# Patient Record
Sex: Female | Born: 1990 | Race: White | Hispanic: No | Marital: Married | Smoking: Never smoker
Health system: Southern US, Academic
[De-identification: ages and names within clinical notes are randomized; demographics above are authoritative.]

## PROBLEM LIST (undated history)

## (undated) DIAGNOSIS — E7800 Pure hypercholesterolemia, unspecified: Secondary | ICD-10-CM

## (undated) DIAGNOSIS — F419 Anxiety disorder, unspecified: Secondary | ICD-10-CM

## (undated) DIAGNOSIS — M545 Low back pain, unspecified: Secondary | ICD-10-CM

## (undated) HISTORY — DX: Low back pain, unspecified: M54.50

## (undated) HISTORY — DX: Anxiety disorder, unspecified: F41.9

---
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
## 2021-01-14 IMAGING — US US Pelvic Complete
1 series · 13 of 16 positions shown · non-contrast
Comparison: None.

US Pelvic Complete
INDICATION: Vaginal Bleeding retained products of conception.                            
 LMP: Unsure. Post menopausal: No.
HISTORY: The patient states she is 5 days postpartum after an induced                     
 stillbirth                                                                                
 measuring 18 weeks at the 24 week mark.
TECHNIQUE: Transabdominal pelvic ultrasound was performed.                                
 Doppler: Color Doppler was utilized.                                                      
 Technologist Comments: None.                                                              
 Limitations: None.

[Series 1: us pelvic complete · 51 acquisitions, 13 frames shown]
[im 1/51]
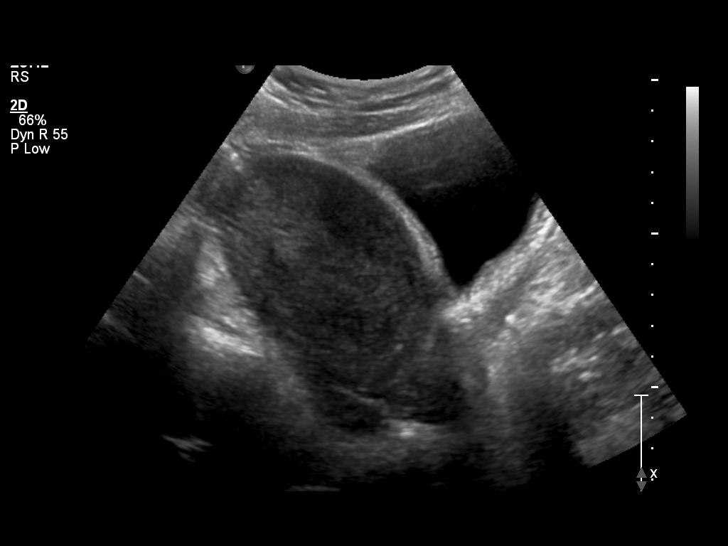
[im 4/51]
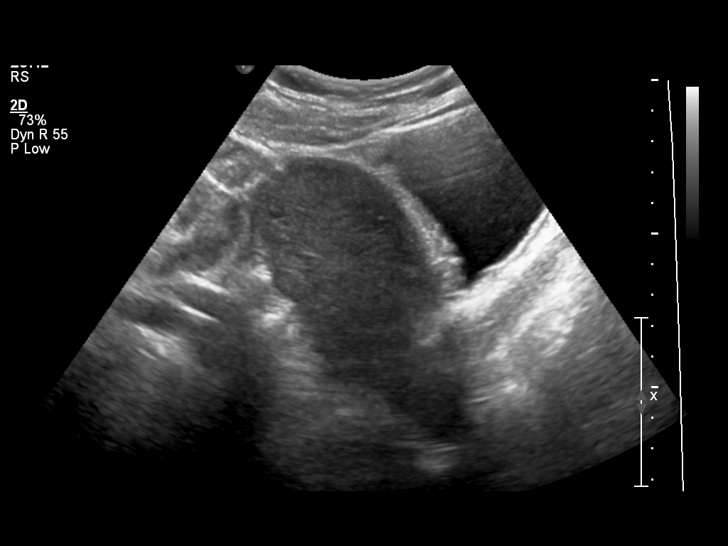
[im 11/51]
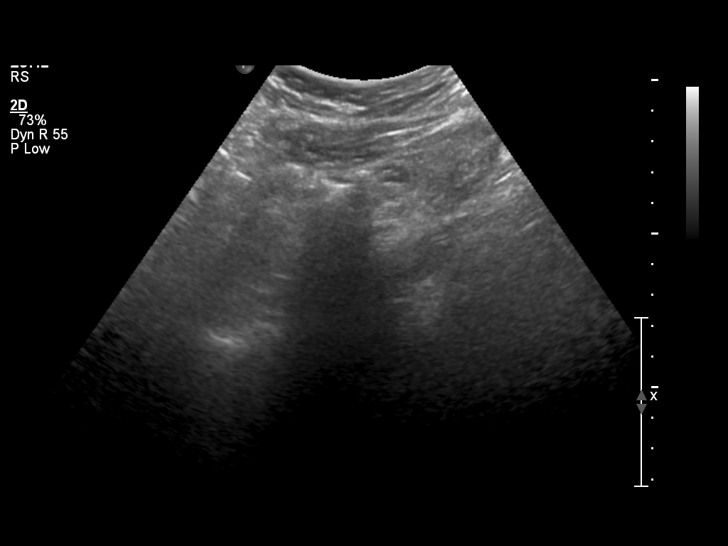
[im 14/51]
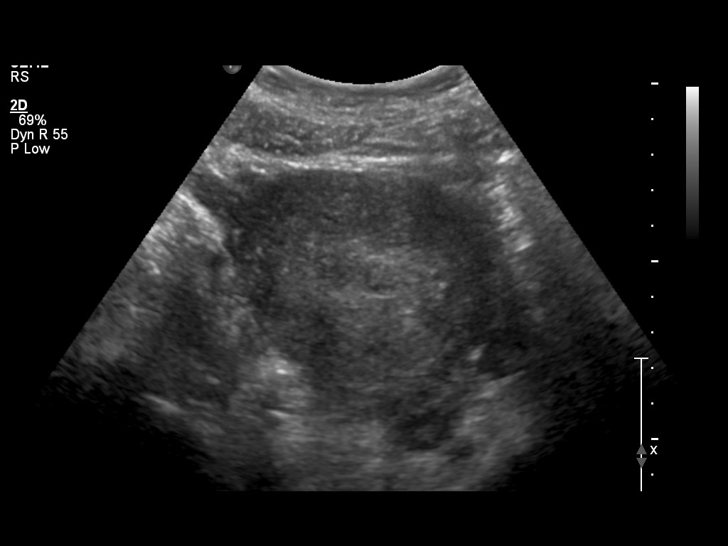
[im 17/51]
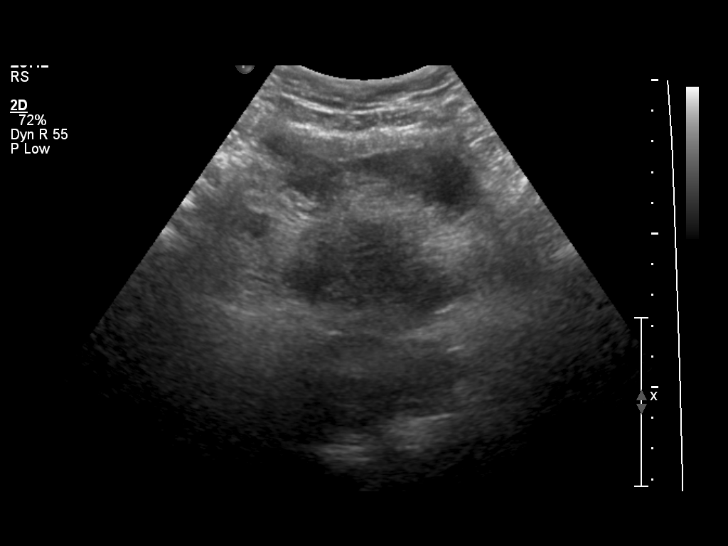
[im 21/51]
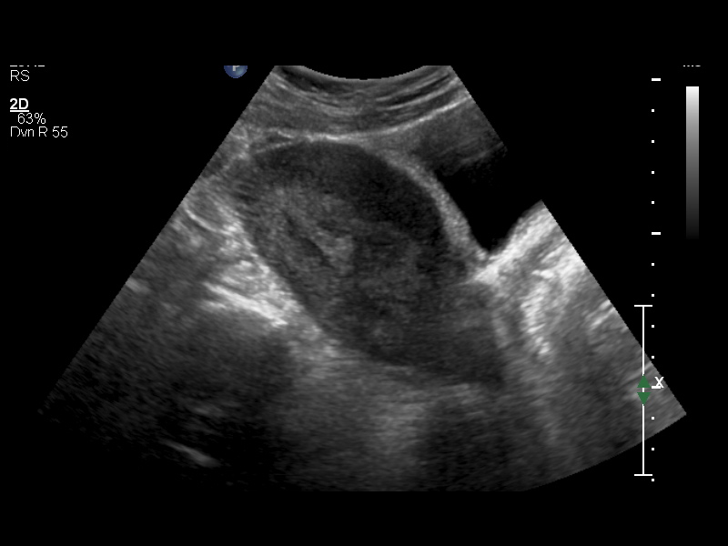
[im 27/51]
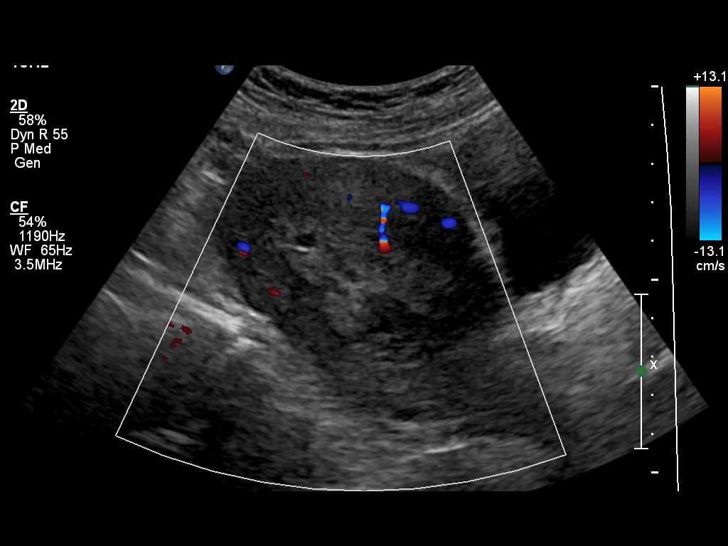
[im 31/51]
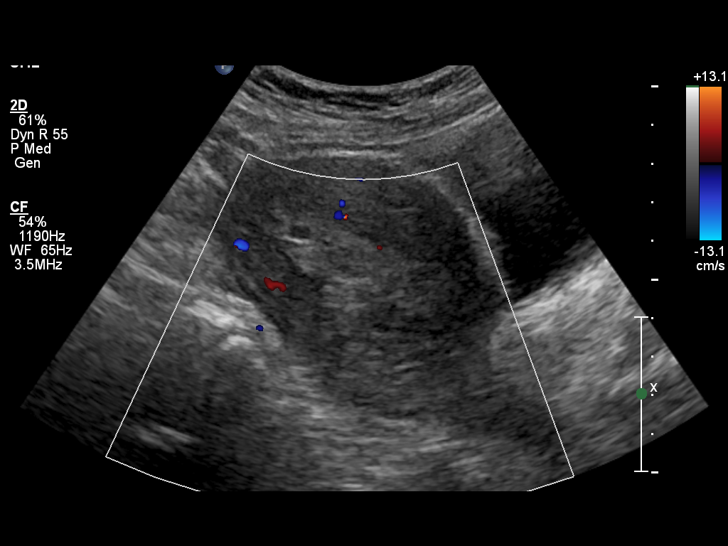
[im 34/51]
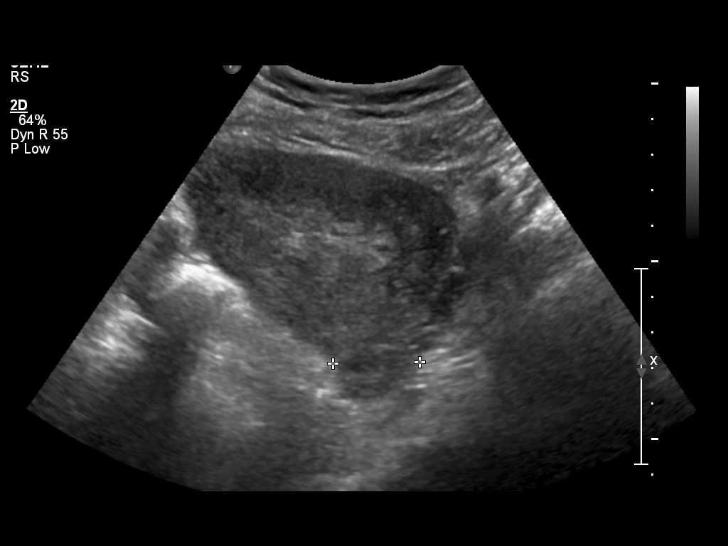
[im 37/51]
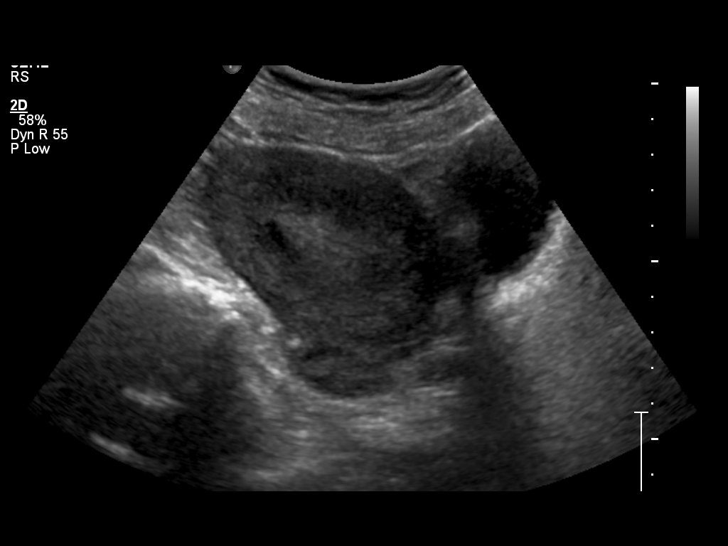
[im 41/51]
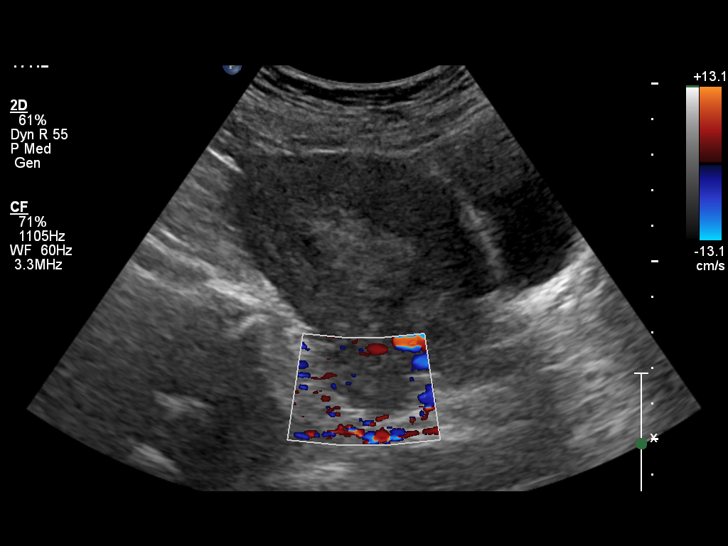
[im 47/51]
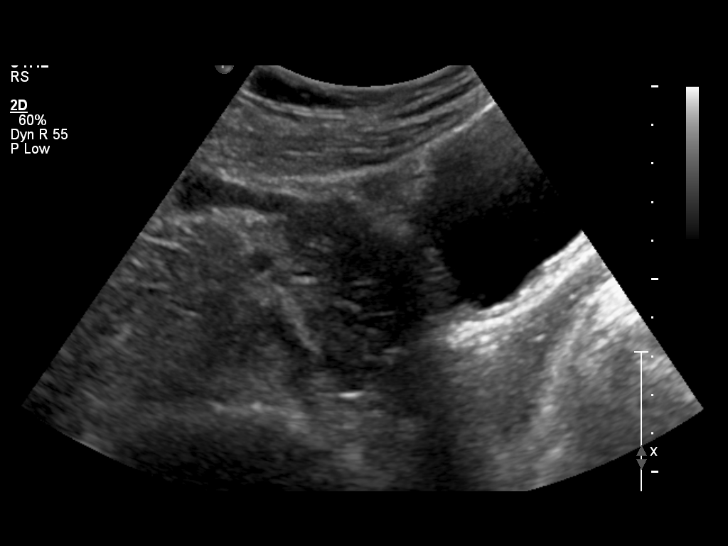
[im 51/51]
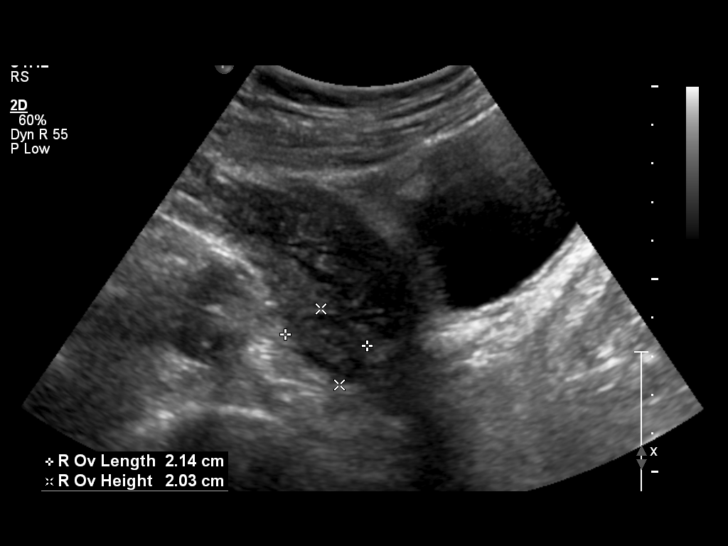

[13 of 16 positions shown; findings below may reference images not displayed]

FINDINGS: UTERUS                                                                                    
 Size (cm): 10.8 cm x 4.8 cm x 7.1 cm                                                      
 Appearance: Normal.                                                                       
 Lesion(s): None.                                                                          
 ENDOMETRIUM                                                                               
 Thickness (cm): 2.8 cm. Thickened with some vascularity appearing to enter into           
 the endometrium.                                                                          
 Cavity: Heterogenous.                                                                     
 RIGHT OVARY                                                                               
 Size (cm): 2.1 cm x 2.0 cm x 2.4 cm.                                                      
 Doppler: Blood flow identified by color Doppler.                                          
 Lesion (cm): None, normal appearance.                                                     
 Adnexa: Grossly normal.                                                                   
 LEFT OVARY                                                                                
 Size (cm): 3.1 cm x 1.5 cm x 2.4 cm.                                                      
 Doppler: Blood flow identified by color Doppler.                                          
 Lesion (cm): None, normal appearance.                                                     
 Adnexa: Grossly normal.                                                                   
 ADDITIONAL                                                                                
 Pelvic fluid: No significant pelvic fluid.                                                
 Other: None.
IMPRESSION: 1.  Enlarged heterogeneous uterus consistent with recent postpartum state. ]              
 2.  There is heterogeneous thickened endometrium with vascular flow noted                 
 concerning for retained product of conception.

## 2021-08-17 ENCOUNTER — Other Ambulatory Visit: Payer: Self-pay

## 2021-08-17 ENCOUNTER — Ambulatory Visit: Attending: GENERAL | Admitting: GENERAL

## 2021-08-17 ENCOUNTER — Encounter (INDEPENDENT_AMBULATORY_CARE_PROVIDER_SITE_OTHER): Payer: Self-pay | Admitting: GENERAL

## 2021-08-17 VITALS — BP 121/79 | HR 65 | Temp 97.8°F | Resp 16 | Ht 64.0 in | Wt 162.2 lb

## 2021-08-17 DIAGNOSIS — F418 Other specified anxiety disorders: Secondary | ICD-10-CM | POA: Insufficient documentation

## 2021-08-17 DIAGNOSIS — Z131 Encounter for screening for diabetes mellitus: Secondary | ICD-10-CM | POA: Insufficient documentation

## 2021-08-17 DIAGNOSIS — Z0000 Encounter for general adult medical examination without abnormal findings: Secondary | ICD-10-CM | POA: Insufficient documentation

## 2021-08-17 DIAGNOSIS — O99345 Other mental disorders complicating the puerperium: Secondary | ICD-10-CM | POA: Insufficient documentation

## 2021-08-17 DIAGNOSIS — Z1389 Encounter for screening for other disorder: Secondary | ICD-10-CM | POA: Insufficient documentation

## 2021-08-17 DIAGNOSIS — F411 Generalized anxiety disorder: Secondary | ICD-10-CM | POA: Insufficient documentation

## 2021-08-17 DIAGNOSIS — Z136 Encounter for screening for cardiovascular disorders: Secondary | ICD-10-CM | POA: Insufficient documentation

## 2021-08-17 DIAGNOSIS — Z13220 Encounter for screening for lipoid disorders: Secondary | ICD-10-CM | POA: Insufficient documentation

## 2021-08-17 MED ORDER — FLUOXETINE 20 MG CAPSULE
20.0000 mg | ORAL_CAPSULE | Freq: Every day | ORAL | 0 refills | Status: DC
Start: 2021-09-07 — End: 2021-11-26

## 2021-08-17 MED ORDER — FLUOXETINE 10 MG CAPSULE
10.0000 mg | ORAL_CAPSULE | Freq: Every day | ORAL | 0 refills | Status: DC
Start: 2021-08-17 — End: 2021-10-08

## 2021-08-17 NOTE — Nursing Note (Signed)
Chief Complaint:   Chief Complaint              Establish Care     Physical     Anxiety           Functional Health Screen  Functional Health Screening:        BP 121/79   Pulse 65   Temp 36.6 C (97.8 F) (Oral)   Resp 16   Ht 1.626 m (5\' 4" )   Wt 73.6 kg (162 lb 3.2 oz)   LMP 08/13/2021 (Exact Date)   BMI 27.84 kg/m       Social History     Tobacco Use   Smoking Status Never   Smokeless Tobacco Never     Patient Health Rating           Depression Screening  PHQ Questionnaire  Little interest or pleasure in doing things.: Nearly every day  Feeling down, depressed, or hopeless: Several Days  PHQ 2 Total: 4  Trouble falling or staying asleep, or sleeping too much.: More than half the days  Feeling tired or having little energy: More than half the days  Poor appetite or overeating: Not at all  Feeling bad about yourself/ that you are a failure in the past 2 weeks?: More than half the days  Trouble concentrating on things in the past 2 weeks?: More than half the days  Moving/Speaking slowly or being fidgety or restless  in the past 2 weeks?: Several Days  Thoughts that you would be better off DEAD, or of hurting yourself in some way.: Not at all  PHQ 9 Total: 13  Interpretation of Total Score: 10-14 Moderate depression  Allergies:  No Known Allergies  Medication History  Reviewed for OTC medication and any new medications, provider will review medication history  Results through Enter/Edit  No results found for this or any previous visit (from the past 24 hour(s)).  POCT Results  Care Team  Patient Care Team:  08/15/2021, DO as PCP - General (FAMILY MEDICINE)  Immunizations - last 24 hours       None          Jacinta Shoe, Sharyn Lull  08/17/2021, 09:37

## 2021-08-17 NOTE — Progress Notes (Signed)
[facility] FAMILY MEDICINE  A department of [facility]  [address]  [place] [address]  [phone]    Name: Catherine Colon MRN:  [id number]   Date: 08/17/2021 DOB: December 03, 1990                       Chief complaint:   Chief Complaint   Patient presents with    Establish Care    Physical    Anxiety         Assessment/Plan  Peytan Andringa is a 31 y.o. female with:    1. GAD (generalized anxiety disorder) (Primary)  Anxiety has been long-standing but with worsening symptoms recently. [place]oved to the area from [place] after recent surrogacy pregnancy loss and has felt increased anxiety and like she has not been able to enjoy her children as much. Has been seeking outpatient therapy services but did not feel like they were helpful. Will start fluoxetine 10 mg x 3 weeks and increase to 20 mg at that time. Follow up in 6 weeks.  -     FLUoxetine (PROZAC) 10 mg Oral Capsule; Take 1 Capsule (10 mg total) by mouth Once a day  -     FLUoxetine (PROZAC) 20 mg Oral Capsule; Take 1 Capsule (20 mg total) by mouth Once a day    2. Postpartum anxiety  As above. IUFD at 24 weeks in December 2022.  -     FLUoxetine (PROZAC) 10 mg Oral Capsule; Take 1 Capsule (10 mg total) by mouth Once a day  -     FLUoxetine (PROZAC) 20 mg Oral Capsule; Take 1 Capsule (20 mg total) by mouth Once a day    3. Encounter for lipid screening for cardiovascular disease  Reports elevated cholesterol level in childhood and was on statin therapy until her recent pregnancies. Will reassess and determine therapy. Likely familial hypercholesterolemia.  -     LIPID PANEL; Future    4. Screening for diabetes mellitus  -     HGA1C (HEMOGLOBIN A1C WITH EST AVG GLUCOSE); Future    5. Screening for nephropathy  -     COMPREHENSIVE METABOLIC PANEL, NON-FASTING; Future    Return to clinic: Return in about 6 weeks (around 09/28/2021) for Mood Check, Med Check - fluoxetine.      Health Maintenance:  Blood pressure screen: 121/79    Colon cancer screen: N/A  [Current guidelines start at age 2; stop at age 33]   Breast cancer screen: N/A [Current guidelines start at age 59; stop age at 75]   Cervical cancer screen: Reports last Pap smear during surrogacy workup in early 2022; denies history of abnormal Pap [Current guidelines start at age 37; stop at age 1]   Osteoporosis screen: N/A    Lung cancer screen: N/A [Age 56-77; 20 pack-yrs; current or stopped smoking w/i last 15 years]   Lipid screen: Pending/ Ordered    Diabetes screen: Pending/ Ordered    Tobacco/alcohol use: None    Diet/exercise counseling: Normal diet and exercise         Subjective:  History of Presenting Illness:   [name] is a 31 y.o. female presenting to establish care.     Patient presents with a Hx of depression/anxiety. Symptoms have been present for many years, but has since worsened. She has 2 young kids at home and feels herself being "too busy" for them or easily irritated by them. She feels that she is missing "watching them  grow up." Patient participated in a surrogacy 1 year ago and had a miscarriage in December. This hit her hard emotionally, and the family moved soon after. Post partum, she is doing better. She does not feel that her depression stems directly from this event. She has tried therapy in the past with Fritzi Mandes , which didn't help her much. She would like to try medication options.       Objective  Vitals:   BP 121/79   Pulse 65   Temp 36.6 C (97.8 F) (Oral)   Resp 16   Ht 1.626 m (5\' 4" )   Wt 73.6 kg (162 lb 3.2 oz)   LMP 08/13/2021 (Exact Date)   BMI 27.84 kg/m       Physical Exam  Constitutional:       Appearance: Normal appearance.   HENT:      Head: Atraumatic.      Right Ear: External ear normal.      Left Ear: External ear normal.      Nose: Nose normal.      Mouth/Throat:      Pharynx: Oropharynx is clear.   Eyes:      Conjunctiva/sclera: Conjunctivae normal.   Cardiovascular:      Rate and Rhythm: Normal rate and regular rhythm.      Pulses: Normal  pulses.      Heart sounds: Normal heart sounds.   Pulmonary:      Effort: Pulmonary effort is normal.      Breath sounds: Normal breath sounds.   Abdominal:      General: Bowel sounds are normal.   Musculoskeletal:         General: Normal range of motion.      Cervical back: Normal range of motion.   Skin:     General: Skin is warm and dry.   Neurological:      Mental Status: She is oriented to person, place, and time.   Psychiatric:         Mood and Affect: Mood normal.         Behavior: Behavior normal.         Thought Content: Thought content normal.         Judgment: Judgment normal.       I am scribing for, and in the presence of, Dr. 08/15/2021 for services provided on 08/17/2021.  08/19/2021, SCRIBE   Daryl Eastern, SCRIBE      I personally performed the services described in this documentation, as scribed  in my presence, and it is both accurate  and complete.    Daryl Eastern, DO  [facility]    Portions of this note may be dictated using voice recognition software or a dictation service. Variances in spelling and vocabulary are possible and unintentional. Not all errors are caught/corrected. Please notify the Jacinta Shoe if any discrepancies are noted or if the meaning of any statement is not clear.

## 2021-08-17 NOTE — Patient Instructions (Signed)
Instructions for Labs Ordered --    Cholesterol: Ideally is performed fasting - nothing to eat or drink except water or black coffee (no sugar) for 12 to 16 hours prior to getting labs drawn. The value most affected by eating prior to this lab is triglycerides, so if fasting is not feasible, then please get the labs done when it is convenient for you.    Hemoglobin A1c: This is an estimate of your blood sugar over the last 3 months. This value is not affected by eating the day of the lab but could be affected by anemia or other things that affect red blood cells.    Comprehensive Metabolic Panel (CMP): This test helps us to measure your electrolytes, kidney function and liver function. Unless expressly told, this does not need to be performed fasting.

## 2021-09-08 ENCOUNTER — Other Ambulatory Visit: Attending: GENERAL

## 2021-09-08 ENCOUNTER — Other Ambulatory Visit: Payer: Self-pay

## 2021-09-08 DIAGNOSIS — Z136 Encounter for screening for cardiovascular disorders: Secondary | ICD-10-CM | POA: Insufficient documentation

## 2021-09-08 DIAGNOSIS — Z13220 Encounter for screening for lipoid disorders: Secondary | ICD-10-CM | POA: Insufficient documentation

## 2021-09-08 DIAGNOSIS — Z131 Encounter for screening for diabetes mellitus: Secondary | ICD-10-CM | POA: Insufficient documentation

## 2021-09-08 DIAGNOSIS — Z1389 Encounter for screening for other disorder: Secondary | ICD-10-CM | POA: Insufficient documentation

## 2021-09-08 LAB — COMPREHENSIVE METABOLIC PANEL, NON-FASTING
ALBUMIN: 4.2 g/dL (ref 3.5–5.0)
ALKALINE PHOSPHATASE: 61 U/L (ref 40–110)
ALT (SGPT): 17 U/L (ref 8–22)
ANION GAP: 7 mmol/L (ref 4–13)
AST (SGOT): 16 U/L (ref 8–45)
BILIRUBIN TOTAL: 0.5 mg/dL (ref 0.3–1.3)
BUN/CREA RATIO: 18 (ref 6–22)
BUN: 14 mg/dL (ref 8–25)
CALCIUM: 9.6 mg/dL (ref 8.5–10.0)
CHLORIDE: 106 mmol/L (ref 96–111)
CO2 TOTAL: 24 mmol/L (ref 22–30)
CREATININE: 0.8 mg/dL (ref 0.60–1.05)
ESTIMATED GFR: >90 mL/min/BSA (ref >=60)
GLUCOSE: 81 mg/dL (ref 65–125)
POTASSIUM: 3.9 mmol/L (ref 3.5–5.1)
PROTEIN TOTAL: 6.9 g/dL (ref 6.4–8.3)
SODIUM: 137 mmol/L (ref 136–145)

## 2021-09-08 LAB — LIPID PANEL
CHOL/HDL RATIO: 4.3
CHOLESTEROL: 289 mg/dL — ABNORMAL HIGH (ref 100–200)
HDL CHOL: 68 mg/dL (ref >=50)
LDL CALC: 215 mg/dL — ABNORMAL HIGH (ref <100)
NON-HDL: 221 mg/dL — ABNORMAL HIGH (ref <=190)
TRIGLYCERIDES: 49 mg/dL (ref <150)
VLDL CALC: 10 mg/dL (ref <30)

## 2021-09-08 LAB — HGA1C (HEMOGLOBIN A1C WITH EST AVG GLUCOSE)
ESTIMATED AVERAGE GLUCOSE: 105 mg/dL
HEMOGLOBIN A1C: 5.3 % (ref 4.0–5.6)

## 2021-10-08 ENCOUNTER — Encounter (INDEPENDENT_AMBULATORY_CARE_PROVIDER_SITE_OTHER): Payer: Self-pay | Admitting: GENERAL

## 2021-10-08 ENCOUNTER — Other Ambulatory Visit: Payer: Self-pay

## 2021-10-08 ENCOUNTER — Ambulatory Visit: Attending: GENERAL | Admitting: GENERAL

## 2021-10-08 VITALS — BP 115/74 | HR 59 | Temp 97.8°F | Resp 16 | Ht 64.0 in | Wt 160.6 lb

## 2021-10-08 DIAGNOSIS — F418 Other specified anxiety disorders: Secondary | ICD-10-CM

## 2021-10-08 DIAGNOSIS — E785 Hyperlipidemia, unspecified: Secondary | ICD-10-CM | POA: Insufficient documentation

## 2021-10-08 DIAGNOSIS — O99345 Other mental disorders complicating the puerperium: Secondary | ICD-10-CM | POA: Insufficient documentation

## 2021-10-08 DIAGNOSIS — F411 Generalized anxiety disorder: Secondary | ICD-10-CM | POA: Insufficient documentation

## 2021-10-08 MED ORDER — ROSUVASTATIN 20 MG TABLET
20.0000 mg | ORAL_TABLET | Freq: Every evening | ORAL | 4 refills | Status: DC
Start: 2021-10-08 — End: 2022-10-22

## 2021-10-08 NOTE — Nursing Note (Signed)
Chief Complaint:   Chief Complaint              Follow Up Mood Check     Medication Check           Functional Health Screen  Functional Health Screening:        BP 115/74   Pulse 59   Temp 36.6 C (97.8 F) (Oral)   Resp 16   Ht 1.626 m (5\' 4" )   Wt 72.8 kg (160 lb 9.6 oz)   LMP 09/12/2021 (Exact Date)   BMI 27.57 kg/m       Social History     Tobacco Use   Smoking Status Never   Smokeless Tobacco Never     Patient Health Rating           Depression Screening  PHQ Questionnaire     Allergies:  No Known Allergies  Medication History  Reviewed for OTC medication and any new medications, provider will review medication history  Results through Enter/Edit  No results found for this or any previous visit (from the past 24 hour(s)).  POCT Results  Care Team  Patient Care Team:  Otelia Santee, DO as PCP - General (FAMILY MEDICINE)  Immunizations - last 24 hours       None          Lavell Islam, [place]  10/08/2021, 16:32

## 2021-10-08 NOTE — Progress Notes (Signed)
[facility] FAMILY MEDICINE  A department of [facility]  [address]  [place] [address]  [phone]    Name: Catherine Colon MRN:  [id number]   Date: 10/08/2021 DOB: 1990/07/23                       Chief complaint:   Chief Complaint   Patient presents with    Follow Up Mood Check    Medication Check         Assessment/Plan  Catherine Colon is a 31 y.o. female with:    1. GAD (generalized anxiety disorder) (Primary)  Symptoms have significantly improved but still with some mild adverse effects. Will continue with fluoxetine 20 mg once daily for now but discussed that we could increase if the side effects improve and she thinks that anxiety could still be better controlled. Discussed SSRI safety during pregnancy, especially vs stopping medication and having uncontrolled anxiety.    2. Postpartum anxiety  As above.    3. Dyslipidemia  Likely familial hypercholesterolemia. Does not remember which medication she was on previously but does not remember any significant side effects. Will start rosuvastatin 20 mg once daily and recheck lipid panel/hepatic panel after 6 months to assess efficacy. Will have her wait until after her period comes to start statin therapy to ensure that she is not pregnant.  -     rosuvastatin (CRESTOR) 20 mg Oral Tablet; Take 1 Tablet (20 mg total) by mouth Every evening  -     LIPID PANEL; Future  -     HEPATIC FUNCTION PANEL; Future    Return to clinic: Return in about 6 months (around 04/08/2022) for Med Check, Mood Check.      Subjective:  History of Presenting Illness:   Catherine Colon is a 31 y.o. female who presents to clinic for medication check after starting fluoxetine at last visit for GAD and postpartum anxiety. Feels like she has been doing much better.    Did not notice a change initially with medication, but husband noticed that she seemed lighter and was smiling more. Feels like she has felt more of a difference these past couple of weeks. Has been having some  headache since starting medication, including nighttime headaches. These are improving. Had unprotected intercourse with husband prior to 8 week s/p vasectomy -- is due for her period in a few days but wanted to make sure that medication is safe during pregnancy.      Objective  Vitals:   BP 115/74   Pulse 59   Temp 36.6 C (97.8 F) (Oral)   Resp 16   Ht 1.626 m (5\' 4" )   Wt 72.8 kg (160 lb 9.6 oz)   LMP 09/12/2021 (Exact Date)   BMI 27.57 kg/m       Physical Exam  Vitals and nursing note reviewed.   Constitutional:       Appearance: Normal appearance.   HENT:      Head: Normocephalic and atraumatic.   Eyes:      Conjunctiva/sclera: Conjunctivae normal.   Pulmonary:      Effort: Pulmonary effort is normal. No respiratory distress.   Musculoskeletal:      Cervical back: Normal range of motion.   Neurological:      General: No focal deficit present.      Mental Status: She is alert.   Psychiatric:         Mood and Affect: Mood normal.  Behavior: Behavior normal.       Hinton Dyer Nanda Quinton, DO  [facility]    Portions of this note may be dictated using voice recognition software or a dictation service. Variances in spelling and vocabulary are possible and unintentional. Not all errors are caught/corrected. Please notify the [facility] if any discrepancies are noted or if the meaning of any statement is not clear.

## 2021-11-26 ENCOUNTER — Other Ambulatory Visit (INDEPENDENT_AMBULATORY_CARE_PROVIDER_SITE_OTHER): Payer: Self-pay | Admitting: GENERAL

## 2021-11-26 DIAGNOSIS — F418 Other specified anxiety disorders: Secondary | ICD-10-CM

## 2021-11-26 DIAGNOSIS — F411 Generalized anxiety disorder: Secondary | ICD-10-CM

## 2021-11-26 NOTE — Telephone Encounter (Signed)
Non-controlled, chronic  LOV w/i 18 months : yes  No Refills left on Epic    NOV: 04/08/2022    Karl Bales, LPN

## 2021-12-06 ENCOUNTER — Other Ambulatory Visit: Payer: Self-pay

## 2021-12-06 ENCOUNTER — Emergency Department (HOSPITAL_COMMUNITY)

## 2021-12-06 ENCOUNTER — Encounter (HOSPITAL_COMMUNITY): Payer: Self-pay

## 2021-12-06 ENCOUNTER — Emergency Department
Admission: EM | Admit: 2021-12-06 | Discharge: 2021-12-06 | Disposition: A | Discharge status: Home / Self Care | Attending: NURSE PRACTITIONER, FAMILY | Admitting: NURSE PRACTITIONER, FAMILY

## 2021-12-06 DIAGNOSIS — Z1152 Encounter for screening for COVID-19: Secondary | ICD-10-CM | POA: Insufficient documentation

## 2021-12-06 DIAGNOSIS — J189 Pneumonia, unspecified organism: Secondary | ICD-10-CM | POA: Insufficient documentation

## 2021-12-06 DIAGNOSIS — R918 Other nonspecific abnormal finding of lung field: Secondary | ICD-10-CM

## 2021-12-06 DIAGNOSIS — N3289 Other specified disorders of bladder: Secondary | ICD-10-CM

## 2021-12-06 DIAGNOSIS — N3000 Acute cystitis without hematuria: Secondary | ICD-10-CM | POA: Insufficient documentation

## 2021-12-06 LAB — COMPREHENSIVE METABOLIC PANEL, NON-FASTING
ALBUMIN: 4.1 g/dL (ref 3.5–5.0)
ALKALINE PHOSPHATASE: 96 U/L (ref 40–110)
ALT (SGPT): 82 U/L — ABNORMAL HIGH (ref 8–22)
ANION GAP: 10 mmol/L (ref 4–13)
AST (SGOT): 61 U/L — ABNORMAL HIGH (ref 8–45)
BILIRUBIN TOTAL: 0.5 mg/dL (ref 0.3–1.3)
BUN/CREA RATIO: 13 (ref 6–22)
BUN: 11 mg/dL (ref 8–25)
CALCIUM: 9.4 mg/dL (ref 8.6–10.2)
CHLORIDE: 102 mmol/L (ref 96–111)
CO2 TOTAL: 25 mmol/L (ref 22–30)
CREATININE: 0.88 mg/dL (ref 0.60–1.05)
ESTIMATED GFR - FEMALE: 89 mL/min/BSA (ref >=60)
GLUCOSE: 122 mg/dL (ref 65–125)
POTASSIUM: 3.4 mmol/L — ABNORMAL LOW (ref 3.5–5.1)
PROTEIN TOTAL: 6.9 g/dL (ref 6.4–8.3)
SODIUM: 137 mmol/L (ref 136–145)

## 2021-12-06 LAB — CBC WITH DIFF
BASOPHIL #: <0.1 10*3/uL (ref <=0.20)
BASOPHIL %: 0 %
EOSINOPHIL #: <0.1 10*3/uL (ref <=0.50)
EOSINOPHIL %: 0 %
HCT: 37.2 % (ref 34.8–46.0)
HGB: 12.5 g/dL (ref 11.5–16.0)
IMMATURE GRANULOCYTE #: <0.1 10*3/uL (ref <0.10)
IMMATURE GRANULOCYTE %: 1 % (ref 0–1)
LYMPHOCYTE #: 1.73 10*3/uL (ref 1.00–4.80)
LYMPHOCYTE %: 16 %
MCH: 28.4 pg (ref 26.0–32.0)
MCHC: 33.6 g/dL (ref 31.0–35.5)
MCV: 84.5 fL (ref 78.0–100.0)
MONOCYTE #: 1.03 10*3/uL (ref 0.20–1.10)
MONOCYTE %: 9 %
MPV: 9.2 fL (ref 8.7–12.5)
NEUTROPHIL #: 8.27 10*3/uL — ABNORMAL HIGH (ref 1.50–7.70)
NEUTROPHIL %: 74 %
PLATELETS: 228 10*3/uL (ref 150–400)
RBC: 4.4 10*6/uL (ref 3.85–5.22)
RDW-CV: 11.9 % (ref 11.5–15.5)
WBC: 11.2 10*3/uL — ABNORMAL HIGH (ref 3.7–11.0)

## 2021-12-06 LAB — URINALYSIS, MACROSCOPIC
BILIRUBIN: NEGATIVE mg/dL
BLOOD: NEGATIVE mg/dL
GLUCOSE: NORMAL mg/dL
KETONES: NEGATIVE mg/dL
LEUKOCYTES: NEGATIVE WBCs/uL
NITRITE: NEGATIVE
PH: 8 — ABNORMAL HIGH (ref 5.0–7.5)
PROTEIN: NEGATIVE mg/dL
SPECIFIC GRAVITY: 1.01 (ref 1.005–1.020)
UROBILINOGEN: <1 mg/dL (ref <=2.0)

## 2021-12-06 LAB — COVID-19, FLU A/B, RSV RAPID BY PCR
INFLUENZA VIRUS TYPE A: NOT DETECTED
INFLUENZA VIRUS TYPE B: NOT DETECTED
RESPIRATORY SYNCTIAL VIRUS (RSV): NOT DETECTED
SARS-CoV-2: NOT DETECTED

## 2021-12-06 LAB — LIPASE: LIPASE: 15 U/L (ref 10–60)

## 2021-12-06 MED ORDER — IBUPROFEN 800 MG TABLET
800.0000 mg | ORAL_TABLET | ORAL | Status: AC
Start: 2021-12-06 — End: 2021-12-06
  Administered 2021-12-06: 800 mg via ORAL
  Filled 2021-12-06: qty 1

## 2021-12-06 MED ORDER — LEVOFLOXACIN 500 MG TABLET
500.0000 mg | ORAL_TABLET | Freq: Every day | ORAL | 0 refills | Status: AC
Start: 2021-12-06 — End: 2021-12-16

## 2021-12-06 MED ORDER — SODIUM CHLORIDE 0.9 % (FLUSH) INJECTION SYRINGE
2.5000 mL | INJECTION | Freq: Three times a day (TID) | INTRAMUSCULAR | Status: DC
Start: 2021-12-06 — End: 2021-12-07
  Administered 2021-12-06: 2.5 mL via INTRAVENOUS

## 2021-12-06 MED ORDER — AZITHROMYCIN 250 MG TABLET
500.0000 mg | ORAL_TABLET | ORAL | Status: AC
Start: 2021-12-06 — End: 2021-12-06
  Administered 2021-12-06: 500 mg via ORAL
  Filled 2021-12-06: qty 2

## 2021-12-06 MED ORDER — SODIUM CHLORIDE 0.9 % IV BOLUS
1000.0000 mL | INJECTION | Status: AC
Start: 2021-12-06 — End: 2021-12-06
  Administered 2021-12-06: 1000 mL via INTRAVENOUS
  Administered 2021-12-06: 0 mL via INTRAVENOUS

## 2021-12-06 MED ORDER — SODIUM CHLORIDE 0.9 % (FLUSH) INJECTION SYRINGE
10.0000 mL | INJECTION | INTRAMUSCULAR | Status: DC | PRN
Start: 2021-12-06 — End: 2021-12-07

## 2021-12-06 MED ORDER — MORPHINE 2 MG/ML INTRAVENOUS SYRINGE
2.0000 mg | INJECTION | INTRAVENOUS | Status: AC
Start: 2021-12-06 — End: 2021-12-06
  Administered 2021-12-06: 2 mg via INTRAVENOUS
  Filled 2021-12-06: qty 1

## 2021-12-06 NOTE — ED Provider Notes (Signed)
[facility]  [facility]  Emergency Department     HISTORY OF PRESENT ILLNESS     Date:  12/06/2021  Patient's Name:  Catherine Colon  Date of Birth:  07/15/1990    This is a 31 year of age female that presents to the emergency department complaining of bilateral flank/back pain x2 days, fevers and urinary incontinence for the past week or so.  She also endorses that she has been having a cough with productive sputum for the past couple of days.  She denies taking any medications prior to her arrival for her symptoms.    Review of Systems     Review of Systems   Constitutional:  Positive for fever.   Genitourinary:  Positive for flank pain and urgency.        Incontinence   Musculoskeletal:  Positive for back pain.   All other systems reviewed and are negative.      Previous History     Past Medical History:  Past Medical History:   Diagnosis Date    Anxiety        Past Surgical History:  History reviewed. No pertinent surgical history.    Social History:  Social History     Tobacco Use    Smoking status: Never    Smokeless tobacco: Never   Substance Use Topics    Alcohol use: Not Currently     Comment: very rare    Drug use: Never     Social History     Substance and Sexual Activity   Drug Use Never       Family History:  Family History   Problem Relation Age of Onset    High Cholesterol Father     High Cholesterol Sister     Stroke Maternal Grandmother     Heart Attack Paternal Grandfather     Colon Cancer Neg Hx         Unsure if paternal uncle may have had it    Diabetes Neg Hx     Breast Cancer Neg Hx        Medication History:  Current Outpatient Medications   Medication Sig    FLUoxetine (PROZAC) 20 mg Oral Capsule TAKE 1 CAPSULE BY MOUTH EVERY DAY    levoFLOXacin (LEVAQUIN) 500 mg Oral Tablet Take 1 Tablet (500 mg total) by mouth Once a day for 10 days    rosuvastatin (CRESTOR) 20 mg Oral Tablet Take 1 Tablet (20 mg total) by mouth Every evening       Allergies:  No Known Allergies    Physical  Exam     Vitals:    BP 127/75   Pulse 82   Temp 37.4 C (99.3 F)   Resp 16   Ht 1.626 m (5\' 4" )   Wt 70.3 kg (155 lb)   LMP 11/12/2021 (Exact Date)   SpO2 98%   BMI 26.61 kg/m           Physical Exam  Vitals and nursing note reviewed.   Constitutional:       General: She is not in acute distress.     Appearance: She is well-developed.   HENT:      Head: Normocephalic and atraumatic.   Eyes:      Conjunctiva/sclera: Conjunctivae normal.   Cardiovascular:      Rate and Rhythm: Normal rate and regular rhythm.      Heart sounds: No murmur heard.  Pulmonary:      Effort:  Pulmonary effort is normal. No respiratory distress.      Breath sounds: Normal breath sounds.   Abdominal:      Palpations: Abdomen is soft.      Tenderness: There is no abdominal tenderness.   Musculoskeletal:         General: No swelling.      Cervical back: Neck supple.   Skin:     General: Skin is warm and dry.      Capillary Refill: Capillary refill takes less than 2 seconds.   Neurological:      Mental Status: She is alert.   Psychiatric:         Mood and Affect: Mood normal.         Diagnostic Studies/Treatment     Medications:  Medications Administered in the ED   NS flush syringe (2.5 mL Intravenous Given 12/06/21 2116)   NS flush syringe (has no administration in time range)   NS bolus infusion 1,000 mL (0 mL Intravenous Stopped 12/06/21 2216)   ibuprofen (MOTRIN) tablet (800 mg Oral Given 12/06/21 2144)   azithromycin (ZITHROMAX) tablet (500 mg Oral Given 12/06/21 2159)   morphine 2 mg/mL injection (2 mg Intravenous Given 12/06/21 2247)       New Prescriptions    LEVOFLOXACIN (LEVAQUIN) 500 MG ORAL TABLET    Take 1 Tablet (500 mg total) by mouth Once a day for 10 days       Labs:    Results for orders placed or performed during the hospital encounter of 12/06/21   URINALYSIS, MACROSCOPIC   Result Value Ref Range    COLOR Yellow Yellow    APPEARANCE Clear Clear    PH 8.0 (H) 5.0 - 7.5    LEUKOCYTES Negative Negative WBCs/uL     NITRITE Negative Negative    PROTEIN Negative Negative mg/dL    GLUCOSE Normal Normal mg/dL    KETONES Negative Negative mg/dL    UROBILINOGEN < 1 <=3.4 mg/dL    BILIRUBIN Negative Negative mg/dL    BLOOD Negative Negative mg/dL    SPECIFIC GRAVITY 7.425 1.005 - 1.020   COMPREHENSIVE METABOLIC PANEL, NON-FASTING   Result Value Ref Range    SODIUM 137 136 - 145 mmol/L    POTASSIUM 3.4 (L) 3.5 - 5.1 mmol/L    CHLORIDE 102 96 - 111 mmol/L    CO2 TOTAL 25 22 - 30 mmol/L    ANION GAP 10 4 - 13 mmol/L    BUN 11 8 - 25 mg/dL    CREATININE 9.56 3.87 - 1.05 mg/dL    BUN/CREA RATIO 13 6 - 22    ALBUMIN 4.1 3.5 - 5.0 g/dL     CALCIUM 9.4 8.6 - 56.4 mg/dL    GLUCOSE 332 65 - 951 mg/dL    ALKALINE PHOSPHATASE 96 40 - 110 U/L    ALT (SGPT) 82 (H) 8 - 22 U/L    AST (SGOT)  61 (H) 8 - 45 U/L    BILIRUBIN TOTAL 0.5 0.3 - 1.3 mg/dL    PROTEIN TOTAL 6.9 6.4 - 8.3 g/dL    ESTIMATED GFR - FEMALE 89 >=60 mL/min/BSA   LIPASE   Result Value Ref Range    LIPASE 15 10 - 60 U/L   CBC WITH DIFF   Result Value Ref Range    WBC 11.2 (H) 3.7 - 11.0 x10^3/uL    RBC 4.40 3.85 - 5.22 x10^6/uL    HGB 12.5 11.5 - 16.0 g/dL  HCT 37.2 34.8 - 46.0 %    MCV 84.5 78.0 - 100.0 fL    MCH 28.4 26.0 - 32.0 pg    MCHC 33.6 31.0 - 35.5 g/dL    RDW-CV 98.111.9 19.111.5 - 47.815.5 %    PLATELETS 228 150 - 400 x10^3/uL    MPV 9.2 8.7 - 12.5 fL    NEUTROPHIL % 74 %    LYMPHOCYTE % 16 %    MONOCYTE % 9 %    EOSINOPHIL % 0 %    BASOPHIL % 0 %    NEUTROPHIL # 8.27 (H) 1.50 - 7.70 x10^3/uL    LYMPHOCYTE # 1.73 1.00 - 4.80 x10^3/uL    MONOCYTE # 1.03 0.20 - 1.10 x10^3/uL    EOSINOPHIL # <0.10 <=0.50 x10^3/uL    BASOPHIL # <0.10 <=0.20 x10^3/uL    IMMATURE GRANULOCYTE % 1 0 - 1 %    IMMATURE GRANULOCYTE # <0.10 <0.10 x10^3/uL   COVID-19, FLU A/B, RSV RAPID BY PCR   Result Value Ref Range    SARS-CoV-2 Not Detected Not Detected    INFLUENZA VIRUS TYPE A Not Detected Not Detected    INFLUENZA VIRUS TYPE B Not Detected Not Detected    RESPIRATORY SYNCTIAL VIRUS (RSV) Not Detected Not  Detected       Radiology:  CT ABDOMEN PELVIS WO IV CONTRAST  XR AP MOBILE CHEST    XR AP MOBILE CHEST   Final Result   Ill-defined airspace opacity overlying the right upper lobe is suggestive of developing pneumonia.         Radiologist location ID: [id number]         CT ABDOMEN PELVIS WO IV CONTRAST   Final Result   Impression:      1. Several locules of gas present within the urinary bladder. Correlate for recent instrumentation. There is mild thickening of the urinary bladder wall which may be seen with cystitis. No urolithiasis. No hydroureteronephrosis.            Radiologist location ID: [id number]             ECG:  NONE      Procedure     Procedures    Course/Disposition/Plan     Course:     ED Course as of 12/06/21 2307   Sun Dec 06, 2021   2113 Plan of care includes IV access, blood work, urinalysis, COVID/flu/RSV swab, also obtain chest x-ray and CT abdomen pelvis.   2305 I [facility] Yet[facility] and her emphysematous cystitis.  Based on our conversation he does believe that the patient most likely has a urinary tract infection even though the urine does not appear grossly infected.  He does recommend that we place the patient on Levaquin which will treat not only her pneumonia but also a potential urinary tract infection, await the urine culture.  He does not believe that she needs to be admitted into the hospital for further workup and evaluation and that she can be discharged home with Levaquin and to follow-up with her PCP for further workup and evaluation.  The Emergency Department impression and any pertinent test results were discussed in detail. The patient is currently stable for discharge. All questions and concerns were answered to their satisfaction and they agree with the plan. Additional verbal discharge instructions were given and discussed with the patient. Indications for immediate return to the emergency department and the importance of  timely follow up  were discussed.          Disposition:    Discharged    Condition at Disposition:   Stable      Follow up:   Jacinta Shoe, DO  [address]  [place] [address]  [phone]            Clinical Impression:     Clinical Impression   Pneumonia (Primary)   Acute cystitis without hematuria       Future Appointments Scheduled in Epic:  Future Appointments   Date Time Provider Department Center   04/08/2022  4:20 PM Jacinta Shoe, DO [facility]       Thank you for allowing me to participate in the care of your patient. Please feel free to contact me if there are further questions.     Imagene Sheller, APRN      [facility] disclaimer:  Portions of this note may be dictated using voice recognition software or a dictation service. Variances in spelling and vocabulary are possible and unintentional. Not all errors are caught/corrected. Please notify the Thereasa Parkin if any discrepancies are noted or if the meaning of any statement is not clear.

## 2021-12-06 NOTE — ED Notes (Signed)
Called [facility] for Urology consult with [place].

## 2021-12-06 NOTE — ED Triage Notes (Signed)
Pt presents to ED via POV c/o bilateral flank/back pain x2 days. Pt also reports having fevers and urinary incontinence for the past week. Pt has hx of kidney infections and states previous infections presented the same. VSS. Pt febrile in triage 102.8. Pt Aox4.

## 2021-12-06 NOTE — ED Nurses Note (Signed)
Pt ambulatory to restroom at this time. 

## 2021-12-06 NOTE — ED Notes (Signed)
Called [facility] for consult with Urology, currently speaking to [facility].

## 2021-12-06 NOTE — ED Nurses Note (Signed)
Michael,FNP at bedside.

## 2021-12-06 NOTE — ED Nurses Note (Signed)
Michael,FNP at bedside.

## 2021-12-06 NOTE — ED Notes (Signed)
Paged [facility]rology for consult with [place], currently speaking.

## 2021-12-06 NOTE — ED Nurses Note (Signed)
Patient discharged home with family.  AVS reviewed with patient/care giver.  A written copy of the AVS and discharge instructions was given to the patient/care giver.  Questions sufficiently answered as needed.  Patient/care giver encouraged to follow up with PCP as indicated.  In the event of an emergency, patient/care giver instructed to call 911 or go to the nearest emergency room.

## 2022-01-08 ENCOUNTER — Telehealth (INDEPENDENT_AMBULATORY_CARE_PROVIDER_SITE_OTHER): Admitting: Physician Assistant

## 2022-01-08 ENCOUNTER — Encounter (INDEPENDENT_AMBULATORY_CARE_PROVIDER_SITE_OTHER): Payer: Self-pay | Admitting: GENERAL

## 2022-01-08 ENCOUNTER — Other Ambulatory Visit (INDEPENDENT_AMBULATORY_CARE_PROVIDER_SITE_OTHER): Payer: Self-pay | Admitting: Physician Assistant

## 2022-01-08 DIAGNOSIS — U071 COVID-19: Secondary | ICD-10-CM

## 2022-01-08 MED ORDER — NIRMATRELVIR 300 MG (150 MG X2)-RITONAVIR 100 MG TABLET,DOSE PACK
3.0000 | ORAL_TABLET | Freq: Two times a day (BID) | ORAL | 0 refills | Status: AC
Start: 2022-01-08 — End: 2022-01-13

## 2022-01-08 NOTE — E-Visit Routing Comment (Signed)
E-Visit has been sent to the pool.  Use "REPLY" in the E-Visit encounter to respond directly to your patient, add a diagnosis, then SIGN.  If you experience any technical issues, please reach out to the IT HELP desk.

## 2022-01-08 NOTE — Progress Notes (Signed)
[facility]  [address]  [place] [address]  [facility]  E-Visit Note      Attending:george  Reason for E-Visit:   Visit Diagnosis with associated orders:  No diagnosis found.      [facility] Mychart E-Visit Covid-19 Base       Question 01/08/2022  6:50 PM EST - Filed by Patient    Have you been in direct contact with a person with COVID-19 within the last 14 days? Yes    Have you tested positive for COVID-19? Yes    Please enter the date you tested positive.  01/08/2022    Are you taking any medications for your symptoms? Yes    Please list the medications your are taking for your symptoms. Alka Seltzer    Do you have a fever or are feeling feverish (chills, sweating)? No    If you have a thermometer, what is your current temperature (in [place])?  (range: 0 - 150) 97.9    Have you had any of the following symptoms?     Are you having a cough today? Yes    Do you have a runny or stuffy nose?   Yes    Are you experiencing severe and constant pain or pressure in the chest? No    Do you have loss of taste? No    Do you have loss of smell? No    Are you vomiting? No    Are you experiencing diarrhea? No    Are you experiencing fatigue today? Yes    Do you have muscle aches, body aches, or a headache? Yes    Are you having difficulty breathing?  No trouble breathing    Do you feel more ill than you did yesterday or about the same? About the same    Is there someone regularly available to help you take care of yourself? Yes    Do any of these apply to you? (Check any) None of the above            No images are attached to the encounter or orders placed in the encounter.     No orders of the defined types were placed in this encounter.    Hello, you should quarantine at home for the first 5 days from when your symptoms started. Then wear a mask anywhere outside the home for the next 5 days.  You should monitor her respiratory status and seek immediate medical  attention if you develop shortness of breath or worsening symptoms.  You can get a home pulse oximeter to help monitor your oxygen saturations.  Seek medical attention if the O2 sats are less than 90%.    If you are developing pain in your back I would recommend being evaluated in person as you may need a chest x-ray.  You can be seen at your PCP or urgent care.      The patient (or their delegate) initiated request for e-visit using MyChart patient portal.  See MyChart messages documented in this encounter for details of online digital evaluation and management provided.    I personally spent 4 minutes of cumulative time performing this service (within 7 days since e-visit initiated).    Hassell Done, PA-C  01/08/2022, 18:58

## 2022-01-19 ENCOUNTER — Encounter (INDEPENDENT_AMBULATORY_CARE_PROVIDER_SITE_OTHER): Payer: Self-pay | Admitting: GENERAL

## 2022-01-20 ENCOUNTER — Other Ambulatory Visit (INDEPENDENT_AMBULATORY_CARE_PROVIDER_SITE_OTHER): Payer: Self-pay | Admitting: Family Medicine

## 2022-01-20 DIAGNOSIS — F411 Generalized anxiety disorder: Secondary | ICD-10-CM

## 2022-01-20 DIAGNOSIS — O99345 Other mental disorders complicating the puerperium: Secondary | ICD-10-CM

## 2022-01-20 MED ORDER — FLUOXETINE 40 MG CAPSULE
40.0000 mg | ORAL_CAPSULE | Freq: Every day | ORAL | 0 refills | Status: DC
Start: 2022-01-20 — End: 2022-04-08

## 2022-01-20 NOTE — Progress Notes (Signed)
See MyChart message. Increased Prozac to 40 mg as trial.    Scharlene Corn, MD      01/20/2022   12:30

## 2022-04-01 ENCOUNTER — Other Ambulatory Visit: Payer: Self-pay

## 2022-04-01 ENCOUNTER — Other Ambulatory Visit: Attending: GENERAL

## 2022-04-01 DIAGNOSIS — E785 Hyperlipidemia, unspecified: Secondary | ICD-10-CM | POA: Insufficient documentation

## 2022-04-01 LAB — LIPID PANEL
CHOL/HDL RATIO: 2.6
CHOLESTEROL: 215 mg/dL — ABNORMAL HIGH (ref 100–200)
HDL CHOL: 84 mg/dL (ref >=50)
LDL CALC: 125 mg/dL — ABNORMAL HIGH (ref <100)
NON-HDL: 131 mg/dL (ref <=190)
TRIGLYCERIDES: 36 mg/dL (ref <150)
VLDL CALC: 6 mg/dL (ref <30)

## 2022-04-01 LAB — HEPATIC FUNCTION PANEL
ALBUMIN: 4.5 g/dL (ref 3.5–5.0)
ALKALINE PHOSPHATASE: 60 U/L (ref 40–110)
ALT (SGPT): 35 U/L — ABNORMAL HIGH (ref 8–22)
AST (SGOT): 27 U/L (ref 8–45)
BILIRUBIN DIRECT: 0.2 mg/dL (ref 0.1–0.4)
BILIRUBIN TOTAL: 0.5 mg/dL (ref 0.3–1.3)
PROTEIN TOTAL: 6.8 g/dL (ref 6.4–8.3)

## 2022-04-08 ENCOUNTER — Other Ambulatory Visit: Payer: Self-pay

## 2022-04-08 ENCOUNTER — Ambulatory Visit: Attending: GENERAL | Admitting: GENERAL

## 2022-04-08 ENCOUNTER — Encounter (INDEPENDENT_AMBULATORY_CARE_PROVIDER_SITE_OTHER): Payer: Self-pay | Admitting: GENERAL

## 2022-04-08 VITALS — BP 113/75 | HR 61 | Temp 98.3°F | Resp 16 | Ht 64.0 in | Wt 168.0 lb

## 2022-04-08 DIAGNOSIS — F418 Other specified anxiety disorders: Secondary | ICD-10-CM | POA: Insufficient documentation

## 2022-04-08 DIAGNOSIS — F411 Generalized anxiety disorder: Secondary | ICD-10-CM | POA: Insufficient documentation

## 2022-04-08 DIAGNOSIS — E785 Hyperlipidemia, unspecified: Secondary | ICD-10-CM | POA: Insufficient documentation

## 2022-04-08 DIAGNOSIS — O99345 Other mental disorders complicating the puerperium: Secondary | ICD-10-CM | POA: Insufficient documentation

## 2022-04-08 MED ORDER — FLUOXETINE 40 MG CAPSULE
40.0000 mg | ORAL_CAPSULE | Freq: Every day | ORAL | 3 refills | Status: DC
Start: 2022-04-08 — End: 2023-01-17

## 2022-04-08 NOTE — Progress Notes (Signed)
[facility]  A department of [facility]  [address]  [place] [address]  [phone]    ESTABLISHED PATIENT - RETURN VISIT    Patient ID: Catherine Colon  DOB: February 26, 1990  MRN: [id number]  Date: 04/08/2022    ASSESSMENT AND PLAN   Catherine Colon is a 32 y.o. patient who presented to the office today for chief complaint of Medication Check F/U, Follow Up Mood Check, and Lab Results      Assessment & Plan  GAD (generalized anxiety disorder)  Continue fluoxetine 40 mg once daily.     Orders:    FLUoxetine (PROZAC) 40 mg Oral Capsule; Take 1 Capsule (40 mg total) by mouth Once a day    Postpartum anxiety  Continue fluoxetine 40 m g once daily.    Orders:    FLUoxetine (PROZAC) 40 mg Oral Capsule; Take 1 Capsule (40 mg total) by mouth Once a day    Dyslipidemia  Lab Results   Component Value Date    CHOLESTEROL 215 (H) 04/01/2022    HDLCHOL 84 04/01/2022    LDLCHOL 125 (H) 04/01/2022    TRIG 36 04/01/2022                 Orders Placed This Encounter    FLUoxetine (PROZAC) 40 mg Oral Capsule          Health Maintenance:  Health Maintenance Due   Topic Date Due    Hepatitis C screening  Never done    HIV Screening  Never done    Adult Tdap-Td (1 - Tdap) Never done    Hepatitis B Vaccine (1 of 3 - 19+ 3-dose series) Never done    Pap smear/HPV  Never done    NonMedicare Preventative Exam  08/18/2022    Covid-19 Vaccine (1 - 2024-25 season) Never done       lack of time for meaningful discussion, will bring patient back for follow up to discuss     Parts of this note were completed with voice recognition software and errors may have escaped proofreading. If you are reading this and do not understand something, please contact the physician for clarification.      SUBJECTIVE   Catherine Colon is a 32 y.o. patient seen on 04/08/2022 for Medication Check F/U, Follow Up Mood Check, and Lab Results    Patient seen unaccompanied.  Doing well. No acute concerns or complaints.      Review of systems as in  subjective    OBJECTIVE   Vitals: BP 113/75   Pulse 61   Temp 36.8 C (98.3 F) (Oral)   Resp 16   Ht 1.626 m (5\' 4" )   Wt 76.2 kg (168 lb)   LMP 03/22/2022 (Exact Date)   BMI 28.84 kg/m       Bml: Body mass index is 28.84 kg/m.     Physical Exam  Vitals and nursing note reviewed.   HENT:      Head: Normocephalic and atraumatic.      Nose: Nose normal.      Mouth/Throat:      Mouth: Mucous membranes are moist.   Pulmonary:      Effort: Pulmonary effort is normal. No respiratory distress.   Neurological:      General: No focal deficit present.      Mental Status: She is alert.   Psychiatric:         Mood and Affect: Mood normal.  Behavior: Behavior normal.         Current Outpatient Medications   Medication Instructions    buPROPion (WELLBUTRIN XL) 150 mg, Oral, DAILY    FLUoxetine (PROZAC) 40 mg, Oral, DAILY    rosuvastatin (CRESTOR) 20 mg, Oral, EVERY EVENING        Catherine Christine, DO    ASSESSMENT AND PLAN AT TOP OF NOTE

## 2022-04-08 NOTE — Nursing Note (Signed)
Chief Complaint:   Chief Complaint              Medication Check F/U     Follow Up Mood Check     Lab Results           Functional Health Screen  Functional Health Screening:        BP 113/75   Pulse 61   Temp 36.8 C (98.3 F) (Oral)   Resp 16   Ht 1.626 m (5\' 4" )   Wt 76.2 kg (168 lb)   LMP 03/22/2022 (Exact Date)   BMI 28.84 kg/m       Social History     Tobacco Use   Smoking Status Never   Smokeless Tobacco Never     Patient Health Rating           Depression Screening  PHQ Questionnaire     Allergies:  No Known Allergies  Medication History  Reviewed for OTC medication and any new medications, provider will review medication history  Results through Enter/Edit  No results found for this or any previous visit (from the past 24 hour(s)).  POCT Results  Care Team  Patient Care Team:  Otelia Santee, DO as PCP - General (FAMILY MEDICINE)  Immunizations - last 24 hours       None          Troy Sine, [place]  04/08/2022, 16:33

## 2022-08-09 ENCOUNTER — Inpatient Hospital Stay
Admission: RE | Admit: 2022-08-09 | Discharge: 2022-08-09 | Disposition: A | Discharge status: Home / Self Care | Source: Ambulatory Visit | Attending: EXTERNAL | Admitting: EXTERNAL

## 2022-08-09 ENCOUNTER — Other Ambulatory Visit: Payer: Self-pay

## 2022-08-09 ENCOUNTER — Other Ambulatory Visit (HOSPITAL_COMMUNITY): Payer: Self-pay | Admitting: EXTERNAL

## 2022-08-09 DIAGNOSIS — M545 Low back pain, unspecified: Secondary | ICD-10-CM

## 2022-08-25 ENCOUNTER — Other Ambulatory Visit (HOSPITAL_COMMUNITY): Payer: Self-pay | Admitting: EXTERNAL

## 2022-08-25 DIAGNOSIS — M545 Low back pain, unspecified: Secondary | ICD-10-CM

## 2022-08-30 ENCOUNTER — Inpatient Hospital Stay
Admission: RE | Admit: 2022-08-30 | Discharge: 2022-08-30 | Disposition: A | Discharge status: Home / Self Care | Source: Ambulatory Visit | Attending: EXTERNAL | Admitting: EXTERNAL

## 2022-08-30 ENCOUNTER — Other Ambulatory Visit: Payer: Self-pay

## 2022-08-30 ENCOUNTER — Encounter (INDEPENDENT_AMBULATORY_CARE_PROVIDER_SITE_OTHER): Payer: Self-pay | Admitting: GENERAL

## 2022-08-30 DIAGNOSIS — M545 Low back pain, unspecified: Secondary | ICD-10-CM | POA: Insufficient documentation

## 2022-08-31 ENCOUNTER — Ambulatory Visit: Attending: GENERAL | Admitting: GENERAL

## 2022-08-31 ENCOUNTER — Other Ambulatory Visit (HOSPITAL_COMMUNITY)

## 2022-08-31 ENCOUNTER — Encounter (INDEPENDENT_AMBULATORY_CARE_PROVIDER_SITE_OTHER): Payer: Self-pay | Admitting: GENERAL

## 2022-08-31 VITALS — BP 120/77 | HR 66 | Temp 98.0°F | Resp 16 | Ht 64.0 in | Wt 169.4 lb

## 2022-08-31 DIAGNOSIS — R350 Frequency of micturition: Secondary | ICD-10-CM | POA: Insufficient documentation

## 2022-08-31 DIAGNOSIS — R3915 Urgency of urination: Secondary | ICD-10-CM

## 2022-08-31 DIAGNOSIS — R32 Unspecified urinary incontinence: Secondary | ICD-10-CM | POA: Insufficient documentation

## 2022-08-31 DIAGNOSIS — M533 Sacrococcygeal disorders, not elsewhere classified: Secondary | ICD-10-CM | POA: Insufficient documentation

## 2022-08-31 DIAGNOSIS — M545 Low back pain, unspecified: Secondary | ICD-10-CM | POA: Insufficient documentation

## 2022-08-31 DIAGNOSIS — G8929 Other chronic pain: Secondary | ICD-10-CM | POA: Insufficient documentation

## 2022-08-31 LAB — URINALYSIS, MICROSCOPIC

## 2022-08-31 LAB — POCT URINE DIPSTICK
BILIRUBIN: NEGATIVE
GLUCOSE: NEGATIVE
KETONE: NEGATIVE
NITRITE: NEGATIVE
PH: 7.5
PROTEIN: NEGATIVE
SPECIFIC GRAVITY: 1.02
UROBILINOGEN: 0.2

## 2022-08-31 NOTE — Assessment & Plan Note (Addendum)
Refer to pelvic floor physical therapy.  Orders:    Refer to Physical Therapy-External; Future

## 2022-08-31 NOTE — Patient Instructions (Addendum)
Pelvic Physical Therapy    [facility] in [place]

## 2022-08-31 NOTE — Progress Notes (Signed)
F[facility]ILY MEDICINE, MEDICAL OFFICE BUILDING  [address]  [place] [address]  Operated by [facility]    ESTABLISHED PATIENT - RETURN VISIT    Patient ID: Catherine Colon  DOB: 1990-06-29  MRN: [id number]  Date: 08/31/2022    ASSESSMENT AND PLAN   Catherine Colon is a 32 y.o. patient who presented to the office today for chief complaint of Back Pain (Pt states she has had back pain since May.) and Urinary Urgency (Pt states she has been having urinary urgency and frequency, says she hasn't had any pain. )      Assessment & Plan  Chronic left-sided low back pain without sciatica  Left-sided low back pain has been progressively worsening despite chiropractic treatment for the last several months. Chiropractor ordered XR and MRI which did not show any specific musculoskeletal concerns. Has also had urinary frequency and stress incontinence for several years after delivery of children. Discussed possible source of pain and urinary issues as constipation and encouraged clean out at home and stool softener use to rule this out as a potential cause.    Orders:    POCT URINE DIPSTICK    URINE CULTURE; Future    URINALYSIS, MICROSCOPIC; Future    Sacroiliac joint dysfunction of left side  Refer to pelvic floor physical therapy.  Orders:    Refer to Physical Therapy-External; Future    Urinary incontinence  Refer to pelvic floor physical therapy.  Orders:    Refer to Physical Therapy-External; Future    Urine frequency    Orders:    POCT URINE DIPSTICK    URINE CULTURE; Future    URINALYSIS, MICROSCOPIC; Future    Urinary urgency    Orders:    POCT URINE DIPSTICK    URINE CULTURE; Future    URINALYSIS, MICROSCOPIC; Future         Return if symptoms worsen or fail to improve.    Health Maintenance:  Health Maintenance Due   Topic Date Due    Hepatitis C screening  Never done    HIV Screening  Never done    Adult Tdap-Td (1 - Tdap) Never done    Hepatitis B Vaccine (1 of 3 - 19+ 3-dose series) Never done    Pap  smear/HPV  Never done    Covid-19 Vaccine (1 - 2023-24 season) Never done    [facility] Preventative Exam  08/18/2022       lack of time for meaningful discussion, will bring patient back for follow up to discuss     Parts of this note were completed with voice recognition software and errors may have escaped proofreading. If you are reading this and do not understand something, please contact the physician for clarification.      SUBJECTIVE   Catherine Colon is a 32 y.o. patient seen on 08/31/2022 for Back Pain (Pt states she has had back pain since May.) and Urinary Urgency (Pt states she has been having urinary urgency and frequency, says she hasn't had any pain. )    Patient seen unaccompanied.    Reports that she has chronic mild, intermittent back pain. Back pain started to get a lot worse in May. No relief with several months of chiropractic treatments, at home exercises and massage. Chiropractor ordered lumbar XR and MRI without significant bony findings and said that next step would be injections.    Has had some mild urinary symptoms since her previous pregnancies. Feels like bladder symptoms got worse when back  pain worsened. Has had several episodes of urinary incontinence, which is new for her. Denies dysuria, foul-smelling odor.    Denies pain or straining with bowel movements. Generally has a bowel movement every day.     Review of systems as in subjective    OBJECTIVE   Vitals: BP 120/77   Pulse 66   Temp 36.7 C (98 F) (Oral)   Resp 16   Ht 1.626 m (5\' 4" )   Wt 76.8 kg (169 lb 6.4 oz)   LMP 08/01/2022 (Exact Date)   BMI 29.08 kg/m       BMl: Body mass index is 29.08 kg/m.     Physical Exam  Vitals and nursing note reviewed.   HENT:      Head: Normocephalic and atraumatic.      Nose: Nose normal.      Mouth/Throat:      Mouth: Mucous membranes are moist.   Pulmonary:      Effort: Pulmonary effort is normal. No respiratory distress.   Musculoskeletal:      Comments: TTP over left lumbar  paravertebral muscles and left SI joint   Neurological:      General: No focal deficit present.      Mental Status: She is alert.   Psychiatric:         Mood and Affect: Mood normal.         Behavior: Behavior normal.     Lumbar XR and lumbosacral MRI results reviewed with patient    Current Outpatient Medications   Medication Instructions    FLUoxetine (PROZAC) 40 mg, Oral, DAILY    rosuvastatin (CRESTOR) 20 mg, Oral, EVERY EVENING        Javarius Tsosie, DO    ASSESSMENT AND PLAN AT TOP OF NOTE

## 2022-08-31 NOTE — Assessment & Plan Note (Addendum)
Orders:    POCT URINE DIPSTICK    URINE CULTURE; Future    URINALYSIS, MICROSCOPIC; Future

## 2022-08-31 NOTE — Assessment & Plan Note (Addendum)
Left-sided low back pain has been progressively worsening despite chiropractic treatment for the last several months. Chiropractor ordered XR and MRI which did not show any specific musculoskeletal concerns. Has also had urinary frequency and stress incontinence for several years after delivery of children. Discussed possible source of pain and urinary issues as constipation and encouraged clean out at home and stool softener use to rule this out as a potential cause.    Orders:    POCT URINE DIPSTICK    URINE CULTURE; Future    URINALYSIS, MICROSCOPIC; Future

## 2022-08-31 NOTE — Nursing Note (Signed)
08/31/22 0800   Urine test  ([employer] Multistix 10 SG)   Performed Status: Automated   Time collected 0825   Color (Ref Range: Yellow) Yellow   Clarity (Ref Range: Clear) Clear   Glucose (Ref Range: Negative mg/dL) Negative   Bilirubin (Ref Range: Negative mg/dL) Negative   Ketones (Ref Range: Negative mg/dL) Negative   Urine Specific Gravity (Ref Range: 1.005 - 1.030) 1.020   Blood (urine) (Ref Range: Negative mg/dL) (!) Small (1+)   pH (Ref Range: 5.0 - 8.0) 7.5   Protein (Ref Range: Negative mg/dL) Negative   Urobilinogen (Ref Range: Normal) 0.2mg /dL (Normal)   Nitrite (Ref Range: Negative) Negative   Leukocytes (Ref Range: Negative WBC's/uL) (!) 1+   Lot # [id number]   Expiration Date 03/18/23   Micro Sent yes   Culture Sent yes   Initials tmg
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
## 2022-09-01 ENCOUNTER — Encounter (INDEPENDENT_AMBULATORY_CARE_PROVIDER_SITE_OTHER): Payer: Self-pay | Admitting: GENERAL

## 2022-09-02 ENCOUNTER — Telehealth (INDEPENDENT_AMBULATORY_CARE_PROVIDER_SITE_OTHER): Payer: Self-pay | Admitting: GENERAL

## 2022-09-02 DIAGNOSIS — R3915 Urgency of urination: Secondary | ICD-10-CM

## 2022-09-02 DIAGNOSIS — R3129 Other microscopic hematuria: Secondary | ICD-10-CM

## 2022-09-02 DIAGNOSIS — R350 Frequency of micturition: Secondary | ICD-10-CM

## 2022-09-02 DIAGNOSIS — R32 Unspecified urinary incontinence: Secondary | ICD-10-CM

## 2022-09-02 LAB — URINE CULTURE: URINE CULTURE: 40000 — AB

## 2022-09-02 NOTE — Telephone Encounter (Signed)
Follow up urinalysis.     Delrae Sawyers, RN

## 2022-09-13 ENCOUNTER — Other Ambulatory Visit: Attending: GENERAL

## 2022-09-13 ENCOUNTER — Other Ambulatory Visit: Payer: Self-pay

## 2022-09-13 DIAGNOSIS — R3915 Urgency of urination: Secondary | ICD-10-CM | POA: Insufficient documentation

## 2022-09-13 DIAGNOSIS — R350 Frequency of micturition: Secondary | ICD-10-CM | POA: Insufficient documentation

## 2022-09-13 DIAGNOSIS — R3129 Other microscopic hematuria: Secondary | ICD-10-CM | POA: Insufficient documentation

## 2022-09-13 DIAGNOSIS — R32 Unspecified urinary incontinence: Secondary | ICD-10-CM | POA: Insufficient documentation

## 2022-09-13 LAB — URINALYSIS, MICROSCOPIC

## 2022-09-16 ENCOUNTER — Encounter (INDEPENDENT_AMBULATORY_CARE_PROVIDER_SITE_OTHER): Payer: Self-pay | Admitting: GENERAL

## 2022-09-16 ENCOUNTER — Other Ambulatory Visit: Payer: Self-pay

## 2022-09-16 ENCOUNTER — Ambulatory Visit: Attending: GENERAL | Admitting: GENERAL

## 2022-09-16 VITALS — BP 113/72 | HR 66 | Temp 98.2°F | Resp 16 | Ht 64.0 in | Wt 169.5 lb

## 2022-09-16 DIAGNOSIS — M545 Low back pain, unspecified: Secondary | ICD-10-CM | POA: Insufficient documentation

## 2022-09-16 DIAGNOSIS — G8929 Other chronic pain: Secondary | ICD-10-CM | POA: Insufficient documentation

## 2022-09-16 DIAGNOSIS — Z9289 Personal history of other medical treatment: Secondary | ICD-10-CM | POA: Insufficient documentation

## 2022-09-16 DIAGNOSIS — Z0000 Encounter for general adult medical examination without abnormal findings: Secondary | ICD-10-CM | POA: Insufficient documentation

## 2022-09-16 DIAGNOSIS — R3915 Urgency of urination: Secondary | ICD-10-CM | POA: Insufficient documentation

## 2022-09-16 DIAGNOSIS — R350 Frequency of micturition: Secondary | ICD-10-CM | POA: Insufficient documentation

## 2022-09-16 NOTE — Assessment & Plan Note (Addendum)
See urine frequency above.  Orders:    US PELVIS NON OB TRANSABDOMINAL/TRANSVAGINAL; Future    US KIDNEY; Future

## 2022-09-16 NOTE — Assessment & Plan Note (Addendum)
Previous lumbar XR, lumbosacral MRI without significant findings. Recommended constipation cleanout, but symptoms have persisted. Will assess lumbosacral/SI joint pain with pelvic US and kidney US to rule out visceral pathology.    Orders:    US PELVIS NON OB TRANSABDOMINAL/TRANSVAGINAL; Future    US KIDNEY; Future
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
## 2022-09-16 NOTE — Assessment & Plan Note (Addendum)
Previous urine testing grossly WNL. Will assess anatomical pathology.  Orders:    US PELVIS NON OB TRANSABDOMINAL/TRANSVAGINAL; Future    US KIDNEY; Future

## 2022-09-16 NOTE — Progress Notes (Addendum)
FAMILY MEDICINE, MEDICAL OFFICE BUILDING  [address]  [place] [address]  Operated by [facility]    ESTABLISHED PATIENT - RETURN VISIT    Patient ID: Catherine Colon  DOB: 07/08/1990  MRN: [id number]  Date: 09/16/2022    ASSESSMENT AND PLAN   Catherine Colon is a 32 y.o. 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    Health Maintenance:  Blood pressure screen: 113/72    Cervical cancer screen: Reports last Pap smear during surrogacy workup in early 2022; denies history of abnormal Pap. Plan for repeat in 2027. [Current guidelines start at age 34; stop at age 64]   Breast cancer screen: Start at age 98 [Current guidelines start at age 34; stop age at 75]   Osteoporosis screen: Start at age 63 31 46 for postmenopausal women or women with risk factors]   Colon cancer screen: Start at age 20 [Current guidelines start at age 50; stop at age 79]   Lung cancer screen: N/A [Age 70-77; 20 pack-yrs; current or stopped smoking w/i last 15 years]   Lipid screen: Dyslipidemia on rosuvastatin    Diabetes screen: 08/2021: A1c 5.3%; repeat in 5 years    HIV screen: Deferred [One-time universal screening age 92-18+]   Hepatitis C screen: Deferred    Tetanus vaccination: Reports UTD    Influenza vaccination: Out of season    COVID vaccination: Out of season    Pneumonia vaccination: Start at age 23 67 29 or chronic heart or lung disease, diabetes, cigarette smoking]   Shingles vaccination: Start at age 66 [Current guidelines start at age 17; 2nd dose 2-6 months after]   Tobacco/alcohol use: Social History     Tobacco Use    Smoking status: Never    Smokeless tobacco: Never   Substance Use Topics    Alcohol use: Not Currently     Comment: very rare    Drug use: Never       Diet/exercise counseling: Discussed            Chronic left-sided low  back pain without sciatica  Previous lumbar XR, lumbosacral MRI without significant findings. Recommended constipation cleanout, but symptoms have persisted. Will assess lumbosacral/SI joint pain with pelvic US and kidney US to rule out visceral pathology.    Orders:    US PELVIS NON OB TRANSABDOMINAL/TRANSVAGINAL; Future    US KIDNEY; Future    Urine frequency  Previous urine testing grossly WNL. Will assess anatomical pathology.  Orders:    US PELVIS NON OB TRANSABDOMINAL/TRANSVAGINAL; Future    US KIDNEY; Future    Urinary urgency  See urine frequency above.  Orders:    US PELVIS NON OB TRANSABDOMINAL/TRANSVAGINAL; Future    US KIDNEY; Future    History of positive PPD  Tuberculosis screening required for work paperwork. Due to history of positive PPD, will assess with CXR.  Orders:    XR CHEST PA AND LATERAL; Future         Return in about 1 year (around 09/16/2023) for Annual physical exam.    Health Maintenance:  Health Maintenance Due   Topic Date Due    Hepatitis C screening  Never done    HIV Screening  Never done    Adult Tdap-Td (1 - Tdap) Never done    Hepatitis  B Vaccine (1 of 3 - 19+ 3-dose series) Never done    Pap smear/HPV  Never done    Covid-19 Vaccine (1 - 2024-25 season) Never done       see orders for HM actions     Parts of this note were completed with voice recognition software and errors may have escaped proofreading. If you are reading this and do not understand something, please contact the physician for clarification.      SUBJECTIVE   Catherine Colon is a 32 y.o. patient seen on 09/16/2022 for Physical (Form needs completed for work.)    Patient seen unaccompanied.  Back and pelvic pain has persisted. Did use Miralax but without improvement in symptoms. Urine symptoms have persisted as well.    Review of systems as in subjective    OBJECTIVE   Vitals: BP 113/72   Pulse 66   Temp 36.8 C (98.2 F) (Oral)   Resp 16   Ht 1.626 m (5\' 4" )   Wt 76.9 kg (169 lb 8 oz)   LMP 08/31/2022 (Exact  Date)   BMI 29.09 kg/m       BMl: Body mass index is 29.09 kg/m.     Physical Exam    Current Outpatient Medications   Medication Instructions    buPROPion (WELLBUTRIN XL) 150 mg, Oral, DAILY    FLUoxetine (PROZAC) 40 mg, Oral, DAILY    rosuvastatin (CRESTOR) 20 mg, Oral, EVERY EVENING        Donovan Gatchel, DO    ASSESSMENT AND PLAN AT TOP OF NOTE

## 2022-09-30 ENCOUNTER — Other Ambulatory Visit: Attending: GENERAL

## 2022-09-30 ENCOUNTER — Inpatient Hospital Stay (HOSPITAL_BASED_OUTPATIENT_CLINIC_OR_DEPARTMENT_OTHER)
Admission: RE | Admit: 2022-09-30 | Discharge: 2022-09-30 | Disposition: A | Discharge status: Home / Self Care | Source: Ambulatory Visit | Attending: GENERAL | Admitting: GENERAL

## 2022-09-30 ENCOUNTER — Other Ambulatory Visit: Payer: Self-pay

## 2022-09-30 DIAGNOSIS — G8929 Other chronic pain: Secondary | ICD-10-CM | POA: Insufficient documentation

## 2022-09-30 DIAGNOSIS — Z9289 Personal history of other medical treatment: Secondary | ICD-10-CM

## 2022-09-30 DIAGNOSIS — M545 Low back pain, unspecified: Secondary | ICD-10-CM | POA: Insufficient documentation

## 2022-09-30 DIAGNOSIS — R3915 Urgency of urination: Secondary | ICD-10-CM

## 2022-09-30 DIAGNOSIS — R350 Frequency of micturition: Secondary | ICD-10-CM | POA: Insufficient documentation

## 2022-09-30 DIAGNOSIS — R7611 Nonspecific reaction to tuberculin skin test without active tuberculosis: Secondary | ICD-10-CM

## 2022-10-18 ENCOUNTER — Encounter (INDEPENDENT_AMBULATORY_CARE_PROVIDER_SITE_OTHER): Payer: Self-pay | Admitting: GENERAL

## 2022-10-21 ENCOUNTER — Other Ambulatory Visit (INDEPENDENT_AMBULATORY_CARE_PROVIDER_SITE_OTHER): Payer: Self-pay | Admitting: GENERAL

## 2022-10-21 DIAGNOSIS — E785 Hyperlipidemia, unspecified: Secondary | ICD-10-CM

## 2022-10-21 NOTE — Telephone Encounter (Signed)
Non-controlled, chronic  LOV w/i 18 months : yes  No Refills left on Epic      Catherine Meller Claggett, LPN

## 2022-10-24 ENCOUNTER — Encounter (INDEPENDENT_AMBULATORY_CARE_PROVIDER_SITE_OTHER): Payer: Self-pay | Admitting: GENERAL

## 2022-10-27 ENCOUNTER — Other Ambulatory Visit: Payer: Self-pay

## 2022-10-27 ENCOUNTER — Ambulatory Visit: Attending: GENERAL | Admitting: GENERAL

## 2022-10-27 ENCOUNTER — Encounter (INDEPENDENT_AMBULATORY_CARE_PROVIDER_SITE_OTHER): Payer: Self-pay | Admitting: GENERAL

## 2022-10-27 VITALS — BP 122/74 | HR 68 | Temp 98.2°F | Resp 18 | Ht 64.0 in | Wt 170.2 lb

## 2022-10-27 DIAGNOSIS — R6882 Decreased libido: Secondary | ICD-10-CM | POA: Insufficient documentation

## 2022-10-27 DIAGNOSIS — F411 Generalized anxiety disorder: Secondary | ICD-10-CM | POA: Insufficient documentation

## 2022-10-27 DIAGNOSIS — Z23 Encounter for immunization: Secondary | ICD-10-CM | POA: Insufficient documentation

## 2022-10-27 MED ORDER — BUPROPION HCL XL 150 MG 24 HR TABLET, EXTENDED RELEASE
150.0000 mg | ORAL_TABLET | Freq: Every day | ORAL | 3 refills | Status: DC
Start: 2022-10-27 — End: 2023-01-17
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
## 2022-10-27 NOTE — Assessment & Plan Note (Addendum)
Symptoms improved with fluoxetine, but with possible side effects of headache and decreased libido. Will augment with bupropion to see if this help with these side effects. Discussed alternating medications used for headache to avoid rebound headache from OTC analgesia.    Orders:    buPROPion (WELLBUTRIN XL) 150 mg extended release 24 hr tablet; Take 1 Tablet (150 mg total) by mouth Once a day

## 2022-10-27 NOTE — Progress Notes (Signed)
FAMILY MEDICINE, MEDICAL OFFICE BUILDING  [address]  [place] [address]  Operated by [facility]    ESTABLISHED PATIENT - RETURN VISIT    Patient ID: Catherine Colon  DOB: 29-Apr-1990  MRN: [id number]  Date: 10/27/2022    ASSESSMENT AND PLAN   Catherine Colon is a 32 y.o. patient who presented to the office today for chief complaint of Medication Check      Assessment & Plan  GAD (generalized anxiety disorder)  Symptoms improved with fluoxetine, but with possible side effects of headache and decreased libido. Will augment with bupropion to see if this help with these side effects. Discussed alternating medications used for headache to avoid rebound headache from OTC analgesia.    Orders:    buPROPion (WELLBUTRIN XL) 150 mg extended release 24 hr tablet; Take 1 Tablet (150 mg total) by mouth Once a day    Low libido  See GAD above.    Orders:    buPROPion (WELLBUTRIN XL) 150 mg extended release 24 hr tablet; Take 1 Tablet (150 mg total) by mouth Once a day    Need for immunization against influenza    Orders:    Flu Vaccine, 6 month-adult,0.5 mL IM (Admin)         Return in about 2 months (around 12/27/2022) for Med Check.    Health Maintenance:  Health Maintenance Due   Topic Date Due    Hepatitis C screening  Never done    HIV Screening  Never done    Adult Tdap-Td (1 - Tdap) Never done    Hepatitis B Vaccine (1 of 3 - 19+ 3-dose series) Never done    Pap smear/HPV  Never done    NonMedicare Preventative Exam  08/18/2022    Influenza Vaccine (1) Never done    Covid-19 Vaccine (1 - 2023-24 season) Never done       lack of time for meaningful discussion, will bring patient back for follow up to discuss     Parts of this note were completed with voice recognition software and errors may have escaped proofreading. If you are reading this and do not understand something, please contact the physician for clarification.      SUBJECTIVE   Catherine Colon is a 32 y.o. patient seen on 10/27/2022 for Medication  Check    Patient seen unaccompanied.  Reports that intrusive thoughts and obsessive worries about children's health and safety have improved since starting fluoxetine. Has noticed that she has felt less like herself and has had decreased sex drive recently as well. Has had frequent headaches since starting fluoxetine and will need to take medication, such as Excedrin or Tylenol, every 2-3 days.    Review of systems as in subjective    OBJECTIVE   Vitals: BP 122/74   Pulse 68   Temp 36.8 C (98.2 F) (Oral)   Resp 18   Ht 1.626 m (5\' 4" )   Wt 77.2 kg (170 lb 3.2 oz)   LMP 10/01/2022 (Exact Date)   BMI 29.21 kg/m       BMl: Body mass index is 29.21 kg/m.     Physical Exam  Vitals and nursing note reviewed.   HENT:      Head: Normocephalic and atraumatic.      Nose: Nose normal.      Mouth/Throat:      Mouth: Mucous membranes are moist.   Pulmonary:      Effort: Pulmonary effort is normal. No respiratory  distress.   Neurological:      General: No focal deficit present.      Mental Status: She is alert.   Psychiatric:         Mood and Affect: Mood normal.         Behavior: Behavior normal.         Current Outpatient Medications   Medication Instructions    buPROPion (WELLBUTRIN XL) 150 mg, Oral, DAILY    FLUoxetine (PROZAC) 40 mg, Oral, DAILY    rosuvastatin (CRESTOR) 20 mg, Oral, EVERY EVENING        Temitope Flammer, DO    ASSESSMENT AND PLAN AT TOP OF NOTE

## 2022-11-02 ENCOUNTER — Other Ambulatory Visit (HOSPITAL_BASED_OUTPATIENT_CLINIC_OR_DEPARTMENT_OTHER): Payer: Self-pay | Admitting: GENERAL

## 2022-11-02 DIAGNOSIS — R32 Unspecified urinary incontinence: Secondary | ICD-10-CM

## 2022-11-02 DIAGNOSIS — M533 Sacrococcygeal disorders, not elsewhere classified: Secondary | ICD-10-CM

## 2022-11-03 ENCOUNTER — Other Ambulatory Visit: Payer: Self-pay

## 2022-11-03 ENCOUNTER — Encounter (HOSPITAL_BASED_OUTPATIENT_CLINIC_OR_DEPARTMENT_OTHER): Payer: Self-pay

## 2022-11-03 ENCOUNTER — Ambulatory Visit (HOSPITAL_BASED_OUTPATIENT_CLINIC_OR_DEPARTMENT_OTHER)
Admission: RE | Admit: 2022-11-03 | Discharge: 2022-11-03 | Disposition: A | Discharge status: Still In Hospital | Source: Ambulatory Visit | Attending: GENERAL | Admitting: GENERAL

## 2022-11-03 DIAGNOSIS — M533 Sacrococcygeal disorders, not elsewhere classified: Secondary | ICD-10-CM | POA: Insufficient documentation

## 2022-11-03 DIAGNOSIS — R32 Unspecified urinary incontinence: Secondary | ICD-10-CM | POA: Insufficient documentation

## 2022-11-04 NOTE — PT Evaluation (Signed)
[facility] (A Department of [facility])  [address], [address]  [place], [place] [address]  Phone [phone]  Fax [phone]     Outpatient Rehabilitation Services  Physical Therapy Initial Evaluation    Patient Name: Catherine Colon  Date of Birth: 01-01-1991  MRN: [id number]  Payor: BLUE CROSS BLUE SHIELD / Plan: FEDERAL BLUE CROSS / Product Type: PPO /   Diagnosis:      ICD-10-CM    1. 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  This problem has been going on for about 6 months, insidious onset.  Incontinence occurs on the way to the bathroom, coughing, sneezing, laughing especially if she has a full bladder.  She urinates pretty frequently during the day, sometimes every hour.  She is wearing pads, changing then 2-3 times per day.  They vary in dampness.  She gets up on average one time per night to urinate.  She drinks a cup of coffee and 2 gallons of water per day. She has back pain on the R side that has been going on for about 6 months as well.  She has had chiropractic care for this problem.  Pt has history of 1 full term vaginal delivery, 1 c/section 2020 and 1 vaginal delivery of pre term non viable fetus in 2022. Pt works part time as Statistician.  Pt denies pelvic pain, dyspareunia, constipation.    Pt has given verbal and written consent for pelvic floor examination today.  They declined option of second person in the room.  The risks and benefits of the exam were explained verbally and in writing.    Observation:   Postural assessment: pt stands with mild anterior pelvic tilt  Lumbar AROM WNL in all planes.  Stiffness  felt at end range.  No leg length discrepancy.  R ASIS inferior to L in supine position.  Performed gentle MET.     Abdominal Wall Assessment:  Fair ability to engage core musculature in supine with cues.    Present co contraction of core with cues to perform pelvic floor muscle contraction.  Fair ability to perform diaphragmatic breathing with cues.     57% Pelvic Floor Impact Questionnaire.                           Assessment:   Fall Risk Assessment  Pt has no fall/safety risk identified    Pt is a 32 y/o female presenting with s/s consistent with mixed incontinence sx.  She would benefit from skilled PT to work on pelvic floor muscle retraining and bladder retraining education.  She also had s/s consistent with SIJ dysfunction. She would benefit from skilled PT for pelvic stability exercises.  Prognosis is good.    Goals:   Short-term Goals: In 2 weeks: to be achieved by 11/19/22  Patient will verbalize an understanding of pelvic anatomy and causes of incontinence  Patient will verbalize rationale and purposes for exercises  Patient will be independent in the performance of  a home program of PFM exercises  on a daily basis  Patient will demonstrate decreased overflow muscle activity during PFM contraction    Long Term Goals: In 4-6 weeks: to be achieved by 12/19/22  Patient will demonstrate use of functional PFM contraction by performing a precontraction  ("knack") to eliminate UI during a cough  Patient will incorporate a voiding schedule and urge strategies into his/her daily  routine to manage urgency, frequency, and incontinence with a goal of 120 minutes  of delay during the day and nighttime voiding of 1 time or less.    Discharge symptom index improved to <30%      Plan:     PT 1x/week x6 weeks for therapeutic exercise, self care/ADL training, neuromuscular reeducation, manual therapy.    The risks/benefits of therapy have been discussed with the patient and he/she is in agreement with the established  plan of care.     Therapist :       Nelida Meuse, PT 11/04/2022 11:27

## 2022-11-10 ENCOUNTER — Ambulatory Visit (HOSPITAL_BASED_OUTPATIENT_CLINIC_OR_DEPARTMENT_OTHER)
Admission: RE | Admit: 2022-11-10 | Discharge: 2022-11-10 | Disposition: A | Discharge status: Still In Hospital | Source: Ambulatory Visit | Attending: GENERAL

## 2022-11-10 NOTE — PT Treatment (Signed)
[facility] (A Department of [facility])  [address]  [place], [place] [address]  Phone [phone]  Fax [phone]      Outpatient Rehabilitation Services  Physical Therapy Progress Note    Patient Name: Catherine Colon  Date of Birth: 02-Sep-1990  MRN: [id number]  Payor: BLUE CROSS BLUE SHIELD / Plan: FEDERAL BLUE CROSS / Product Type: PPO /   Diagnosis:        ICD-10-CM     1. 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  She has been working on the exercises.    Objective     Self Care/ADL Training: 15 min  Educated on:  urge suppression techniques and quick flick/ankle pump exercises for Voluntary Urinary Inhibition Reflex to delay voiding and reduce urgency symptoms.  proper voiding amounts and intervals  Avoiding just in case voiding  Avoiding bladder irritants and maximizing water intake  Reviewed bladder diary    Therapeutic Exercise: 15 min  Diaphragmatic breathing x10  Co contraction of pelvic floor musculature and transverse abdominals x15  Co contraction of transverse abdominals/pelvic floor musculature with ball adduction x15 repetitions  Co contraction of transverse abdominals/pelvic floor musculature with tband external rotation x15 repetitions  Co contraction of transverse abdominals/pelvic floor musculature with bridges x15 repetitions    Given for HEP 2-3 times per week. 20 reps.     Total Treatment time:  30 min                        Assessment:   Pt verbalized understanding of bladder retraining methods. She was able to isolate her core/pelvic floor musculature with cues and tolerated progression of exercises well today.  Goals:   Short-term Goals: In 2 weeks: to be achieved by 11/19/22  Patient will verbalize an  understanding of pelvic anatomy and causes of incontinence  Patient will verbalize rationale and purposes for exercises  Patient will be independent in the performance of a home program of PFM exercises  on a daily basis  Patient will demonstrate decreased overflow muscle activity during PFM contraction     Long Term Goals: In 4-6 weeks: to be achieved by 12/19/22  Patient will demonstrate use of functional PFM contraction by performing a precontraction  ("knack") to eliminate UI during a cough  Patient will incorporate a voiding schedule and urge strategies into his/her daily  routine to manage urgency, frequency, and incontinence with a goal of 120 minutes  of delay during the day and nighttime voiding of 1 time or less.    Discharge symptom index improved to <30%        Plan:      PT 1x/week x6 weeks for therapeutic exercise, self care/ADL training, neuromuscular reeducation, manual therapy.     The risks/benefits of therapy have been discussed with the patient and he/she is in agreement with the established plan of care.    Therapist :     Nelida Meuse, PT

## 2022-11-17 ENCOUNTER — Encounter (INDEPENDENT_AMBULATORY_CARE_PROVIDER_SITE_OTHER): Payer: Self-pay | Admitting: GENERAL

## 2022-11-17 DIAGNOSIS — M533 Sacrococcygeal disorders, not elsewhere classified: Secondary | ICD-10-CM

## 2022-11-17 DIAGNOSIS — G8929 Other chronic pain: Secondary | ICD-10-CM

## 2022-11-18 ENCOUNTER — Other Ambulatory Visit: Payer: Self-pay

## 2022-11-18 ENCOUNTER — Ambulatory Visit
Admission: RE | Admit: 2022-11-18 | Discharge: 2022-11-18 | Disposition: A | Discharge status: Still In Hospital | Source: Ambulatory Visit | Attending: GENERAL | Admitting: GENERAL

## 2022-11-18 NOTE — PT Treatment (Signed)
[facility] (A Department of [facility])  [address], [address]  [place], [place] [address]  Phone [phone]  Fax [phone]      Outpatient Rehabilitation Services  Physical Therapy Progress Note    Patient Name: Catherine Colon  Date of Birth: 07-22-1990  MRN: [id number]  Payor: BLUE CROSS BLUE SHIELD / Plan: FEDERAL BLUE CROSS / Product Type: PPO /   Diagnosis:        ICD-10-CM     1. 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 The exercises hurt her back and she finds it hard to delay urge while at work.  She had leaking incident at work today.    Objective     Self Care/ADL Training: Reviewed briefly today  Educated on:  urge suppression techniques and quick flick/ankle pump exercises for Voluntary Urinary Inhibition Reflex to delay voiding and reduce urgency symptoms.  proper voiding amounts and intervals  Avoiding just in case voiding  Avoiding bladder irritants and maximizing water intake  Reviewed bladder diary    Therapeutic Exercise: 25 min  MET for R anterior ilial rotation  Diaphragmatic breathing x10  Co contraction of pelvic floor musculature and transverse abdominals x15  Co contraction of transverse abdominals/pelvic floor musculature with ball adduction x15 repetitions  Co contraction of transverse abdominals/pelvic floor musculature with tband external rotation x15 repetitions  Co contraction of transverse abdominals/pelvic floor musculature with bridges x15 repetitions  Cat/cow with diaphragmatic breathing x15  Quadruped core with alternating shoulder taps x10 B    Given for HEP 2-3 times per week. 20 reps.     Total Treatment time:  30 min                        Assessment:   Pt had less pain with exercises  after MET and modifications.  Encouraged to cont with bladder retraining techniques.  Goals:   Short-term Goals: In 2 weeks: to be achieved by 11/19/22  Patient will verbalize an understanding of pelvic anatomy and causes of incontinence  Patient will verbalize rationale and purposes for exercises  Patient will be independent in the performance of a home program of PFM exercises  on a daily basis  Patient will demonstrate decreased overflow muscle activity during PFM contraction     Long Term Goals: In 4-6 weeks: to be achieved by 12/19/22  Patient will demonstrate use of functional PFM contraction by performing a precontraction  ("knack") to eliminate UI during a cough  Patient will incorporate a voiding schedule and urge strategies into his/her daily  routine to manage urgency, frequency, and incontinence with a goal of 120 minutes  of delay during the day and nighttime voiding of 1 time or less.    Discharge symptom index improved to <30%        Plan:      PT 1x/week x6 weeks for therapeutic exercise, self care/ADL training, neuromuscular reeducation, manual therapy.     The risks/benefits of therapy have been discussed with the patient and he/she is in agreement with the established plan of care.  Therapist :     Nelida Meuse, PT

## 2022-11-19 NOTE — Telephone Encounter (Signed)
Previous MRI was completely normal (so structural issue causing pinched nerve was identified) however, can refer to spine specialists since she has not been seeing improvement with PT.    Jacinta Shoe, DO 11/19/2022 11:07

## 2022-11-23 ENCOUNTER — Ambulatory Visit (HOSPITAL_BASED_OUTPATIENT_CLINIC_OR_DEPARTMENT_OTHER)

## 2022-12-22 ENCOUNTER — Other Ambulatory Visit: Payer: Self-pay

## 2022-12-22 ENCOUNTER — Ambulatory Visit: Attending: PHYSICAL MEDICINE AND REHABILITATION | Admitting: PHYSICAL MEDICINE AND REHABILITATION

## 2022-12-22 ENCOUNTER — Encounter (INDEPENDENT_AMBULATORY_CARE_PROVIDER_SITE_OTHER): Payer: Self-pay | Admitting: PHYSICAL MEDICINE AND REHABILITATION

## 2022-12-22 VITALS — BP 118/76 | Ht 64.0 in | Wt 131.6 lb

## 2022-12-22 DIAGNOSIS — M545 Low back pain, unspecified: Secondary | ICD-10-CM | POA: Insufficient documentation

## 2022-12-22 DIAGNOSIS — M533 Sacrococcygeal disorders, not elsewhere classified: Secondary | ICD-10-CM | POA: Insufficient documentation

## 2022-12-22 DIAGNOSIS — G8929 Other chronic pain: Secondary | ICD-10-CM | POA: Insufficient documentation

## 2022-12-22 DIAGNOSIS — M47816 Spondylosis without myelopathy or radiculopathy, lumbar region: Secondary | ICD-10-CM | POA: Insufficient documentation

## 2022-12-22 NOTE — Progress Notes (Signed)
[facility]   Dept. Physical Medicine and Rehabilitation   Interventional Spine and Pain   [address]  [place], [place] [address]   [phone]      Name: Catherine Colon MRN:  [id number]   Date: 12/22/2022 Age: 32 y.o.   DOB: [birth year]     Today I had a pleasure of evaluating [name] in our office. I performed direct patient interview,  performed thorough targeted musculoskeletal and spine physical exam as well as  reviewed data collected by intake form.    As you know the patient is pleasant 32 y.o. female  with chief complaint of Low Back Pain (States been going on since march of this year. She states it came on gradually. ).          --------------------------------------------------------------------------------------------------------------------  Initial intake and H&P on : 12/22/2022    CC: Low Back Pain (States been going on since march of this year. She states it came on gradually. ).     The patient was referred by Jacinta Shoe, DO for LBP .    The patient reports left paramidline lumbosacral pain that began in April 07, 2022 without any inciting injury.  The pain gradually worsened.    The pain is worsened during her period and during ovulation.     It is a 4-10 in severity on a scale from 0-10.  It is currently a 6/10 in severity.  It is described as crushing, constant    Associated symptoms:  Numbness: One occurrence where she was having temporary numbness in the left leg when running.  Weakness: Denies.   Bowel/Bladder Incontinence: Bladder incontinence - She states it "does not feel like she needs to go until she really needs to go" Some leaking with coughing or sneezing.       Aggravating factors:  Walking, lifting, bending, lying  Alleviating factors:  Staying still, heat    The patient denies acute focal neurological deficits, HA, AMS, chest pain, SOB, bowel or bladder incontinence.  Denies saddle anesthesia.     Prior interventions:  Physical therapy:  Patient is currently in physical therapy - with pelvic floor physical therapy an  also focusing on the back without much relief yet - she is currently 2 months into treatment.   Oral Medications: Ibuprofen, Advil and Tylenol.  Injections: Denies.   Surgery: Denies any spine or joint surgeries.  Acupuncture or Other: Heating pad - with some temporary relief     Occupational/Personal History: Currently working as Herbalist, right hand dominant     Pertinent comorbidities: Generalized anxiety disorder previously on Prozac or Wellbutrin and she has since stopped taking these.     Last HgA1C:   Lab Results   Component Value Date    HA1C 5.3 09/08/2021          --------------------------------------------------------------------------------------------------------------------    --------------------------------------------------------------------------------------------------------------------    Impression:   Chronic left axial low back pain consistent with sacroiliac joint pathology vs myofascial pain with trigger points vs  painful facet arthrosis at  L5-S1 v vs  internal disc disruptions at L5S1 vs L45   History of generalized anxiety disorder      Procedure Summary:  None      ENCOUNTER DIAGNOSES     ICD-10-CM   1. 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 Chronic left-sided low back pain without sciatica  M54.50    G89.29   3. Lumbar spondylosis  M47.816  Plan:  Orders Placed This Encounter    left SIJ at [facility] 3 Prior Auth/Scheduling Procedure       We discussed extensively about DDx and Tx options. Agreed with the following:   The patient has completed >6 weeks of formal physical therapy and then transitioned to physician guided home exercise program without sustained relief.  Recommended the patient follow up Urology for other causes urinary incontinence  XR and MRI of the lumbosacral spine was reviewed with the patient   Proceed with left sacroiliac joint injection.    If no sustained  relief may consider left L3 and L4 medial branch and L5 dorsal ramus block, followed by 2nd diagnostic, followed by radiofrequency ablation if positive  Return for Schedule for left sacroiliac joint injection .  I spent a total of 60 minutes on the day of the visit.  , including review of historical information, examination, documentation , and post-visit activities. Models were shown in the office to provide clear understanding and  course of action.  All question posed to me were answered in fullest and good and full understanding of these answers were achieved.       --------------------------------------------------------------------------------------------------------------------  Radiology and EMG-NCS  review:   I personally reviewed following images and integrated my personal dictations with the radiology reports (if available):    XR LUMBOSACRAL SERIES W FLEX EXT performed on 08/09/2022 7:50 AM.     REASON FOR EXAM:  M54.50: Low back pain     TECHNIQUE: 7 views/7 images submitted for interpretation.     COMPARISON:  None available     FINDINGS:  There is normal bony mineralization. There is normal bony alignment. Vertebral body heights and disc heights are maintained. No fractures are identified. No concerning subluxation is noted. There is no concerning hypermobility on the flexion and extension views.     IMPRESSION:  No concerning findings.    No results found for this or any previous visit (from the past [id number] hour(s)).    No results found for this or any previous visit (from the past [id number] hour(s)).    Recent Results (from the past [id number] hour(s))   MRI SPINE LUMBOSACRAL WO CONTRAST    Collection Time: 08/30/22 10:54 AM    Narrative    Catherine Colon  Female, 32 years old.    MRI SPINE LUMBOSACRAL WO CONTRAST performed on 08/30/2022 10:54 AM.    REASON FOR EXAM:  M54.50: Low back pain    COMPARISON: Lumbar spine series dated August 09, 2022    TECHNIQUE: Lumbar spine MRI with multiplanar multi  sequential imaging obtained without contrast    FINDINGS:    There is normal alignment. Vertebral body heights and disc heights are maintained. No fractures are identified. No concerning subluxation is noted. There is no evidence of pathologic infiltration of the bone marrow. There is no evidence abnormal T2 signal seen within the substance of the spinal cord. The conus medullaris terminates at the normal expected anatomical level. The central bony canal and neuroforamen appear patent at all lumbar levels.      Impression    No concerning findings.      Radiologist location ID: [id number]             --------------------------------------------------------------------------------------------------------------------  Allergies:: No Known Allergies    Past Medical History  Current Outpatient Medications   Medication Sig    buPROPion (WELLBUTRIN XL) 150 mg extended release 24 hr tablet Take 1 Tablet (150 mg total) by mouth Once a  day    FLUoxetine (PROZAC) 40 mg Oral Capsule Take 1 Capsule (40 mg total) by mouth Once a day    rosuvastatin (CRESTOR) 20 mg Oral Tablet TAKE 1 TABLET BY MOUTH EVERY DAY IN THE EVENING     Past Medical History:   Diagnosis Date    Anxiety          Family Medical History:       Problem Relation (Age of Onset)    Heart Attack Paternal Grandfather    High Cholesterol Father, Sister    Stroke Maternal Grandmother            Social History     Socioeconomic History    Marital status: Married   Tobacco Use    Smoking status: Never    Smokeless tobacco: Never   Substance and Sexual Activity    Alcohol use: Not Currently     Comment: very rare    Drug use: Never      Patient Active Problem List   Diagnosis    Dyslipidemia    GAD (generalized anxiety disorder)    Chronic left-sided low back pain without sciatica    Sacroiliac joint dysfunction of left side    Urinary incontinence    Urine frequency    Urinary urgency       Review of system:  There are no exam notes on file for this visit.  Pain  Management  -POSITIVE SYMPTOMS:       No data to display                   I have reviewed and agree with review of systems as documented by the clinical staff.  The pertinent positive and negative symptoms are as per HPI. All other systems reviewed and are negative.      --------------------------------------------------------------------------------------------------------------------     Physical Examination (POS =  Positive  NEG = Negative, BL = bilateral  )  General Vitals: BP 118/76   Ht 1.626 m (5\' 4" )   Wt 59.7 kg (131 lb 9.6 oz)   BMI 22.59 kg/m       Constitutional: Patient in no acute distress. Well-nourished female.   Mental Status: Orientation to person, place, problem, and time. Appropriate behavior,  mood,  and congruent affect. Good eye contact,   no aberrant psychomotor activity. Thought process appropriate:  no tangentiality/ circumstantiality, no perseverative thoughts or flight of ideas, and no redirection needed. Good insight.  HEENT:  Normocephalic.  Atraumatic.  No facial asymmetry.  Hears well bilateral with normal speech production.  Mucous membranes moist.  Conjunctiva clear.  No nuchal tenderness.   Respiration: normal rate and non-labored. No shortness of breath with muscle testing.  No dyspnea with exertion or with talking.  Equal air exchange.    CVS: : No JVD. Warm extremities bilaterally, no ankle edema  Abdomen:  Soft, non-tender, no masses, no guarding or rebound.    Skin: no cyanosis, jaundice, lesions, rashes except: none    Neurologic:  Alert oriented x4.  Speech and affect appropriate.  Speech output appropriate.   Cognition clear.  Memory  intact.  Cranial nerves 2-12 grossly intact. Fine motor control appropriate.  No tremors  Sensory exam: intact in all 4 limbs proximally and distally to light touch.  Motor Exam:  Manual muscle testing at available range of motions    Muscle/Myotome RUE LUE   Shoulder Abduction C5 5/5 5/5   Elbow Flexion C5-C6 5/5 5/5   Elbow  Extension  C6-C7 5/5 5/5   Hand Grip C8 5/5 5/5   Finger Abduction T1 5/5 5/5      Muscle/Myotome RLE LLE   Hip Flexion L2-L3 5/5 5/5   Knee Extension L3-L4 5/5 5/5   Ankle Dorsiflexion L4-L5 5/5 5/5   Great Toe Extension L5 5/5 5/5   Knee Flexion L5-S1 5/5 5/5   Ankle Plantar Flexion S1-S2 5/5 5/5     No give way weakness noted    Reflexes:   Right: biceps 2/4, brachial radialis 2/4, triceps 2/4, patellar 2/4, and achilles 2/4.   Left: biceps 2/4, brachial radialis 2/4, triceps 2/4, patellar 2/4, and achilles 2/4.   Hoffmans Reflex Right: absent.  Hoffmans Reflex Left: absent.  Negative ankle clonus bilaterally.   Negative Babinski and  toes are down going bilaterally.    Musculoskeletal:    Girth:  Muscle definition is appropriate throughout.  Both upper and lower limb muscle bulk and tone are symmetric.  No soft tissue masses.  Joints:  Peripheral joint range of motion within normal limits in the head, neck, trunk and all 4 limbs without evidence of instability subluxation, effusion, or crepitus except otherwise noted below:  Spine:   Cervical Spine and thoracic spine: FROM and no tenderness with no spasm  Spurling's maneuver: Negative bilaterally.    Lumbosacral spine: Mildly  diminished range of motion with mild tenderness  with musucle tightness and spasm  No palpable step-offs.    Fist percussion sign: negative over the midline spine and sacroiliac joints.   Sustain hip flexion: Positive .   Straight leg raising: Deferred  Kemp's test:  POS  on the left at L45 L5S1 areas     SIJ joints:  4-6 positive on the left with pinpoint tenderness over the left sacroiliac joint.  Luisa Hart: Positive left   Distraction on ASIS: Negative bilaterally   Compression: Positive left   Thigh thrust: Negative bilaterally   Gaenslen: Positive left    Yeoman's: Positive left      Hips: Full ROM  Reproducible pain with Internal rotation / log roll: NEG BL  Reproducible pain with External rotation : NEG BL   Tenderness over the gluteal muscles  and tendons:  NEG BL   Reproducible pain over the greater trochanter: Negative bilaterally  Ober's test: NEG BL          Gait/Posture:   Stable without assistive device ( cane, etc) , no lurching gait during stance phase. Gets up from a seated position with some difficulty due to pain, maintains kyphotic posture due to LBP       --------------------------------------------------------------------------------------------------------------------  Our impression, treatment recommendations and plan from today's visit were reviewed in detail with the patient in the office using a model. The patient was given the opportunity to ask questions and those questions were answered to the patient's satisfaction. The patient was encouraged to call with any additional questions or concerns. Instructed patient to call back if symptoms do not improve.         Thank you for allowing me to participate in the plan of care of this patient.  If you have any further questions, please do not hesitate to contact me at my office.    Delmar Landau, M.D.  Interventional Spine Care  [facility]     Portions of this note may be dictated using voice recognition software or a dictation service. Variances in spelling and vocabulary are possible and unintentional. Not all errors  are caught/corrected. Please notify the Thereasa Parkin if any discrepancies are noted or if the meaning of any statement is not clear.

## 2022-12-22 NOTE — Nursing Note (Signed)
12/22/22 1500   Oswestry LBP    I have "Chronic Pain" or pain that has bothered me for 3 months or more Yes   Questionnaire Completed Prior to Surgery   Pain Intensity  3   Personal Care 3   Lifting 1   Walking 1   Sitting 3   Standing 1   Sleeping 2   Social Life 2   Traveling 2   ODI Total Score Oswestry LBP 18   ODI % 36   ODI Scoring % Interpretation   Oswestry Disability Scoring % Interpretation Minimal Disability     / Catherine Client, LPN  [employer]  [address]  [place], [place] [address]  [email or web address]

## 2022-12-29 ENCOUNTER — Encounter (INDEPENDENT_AMBULATORY_CARE_PROVIDER_SITE_OTHER): Payer: Self-pay | Admitting: GENERAL

## 2022-12-29 ENCOUNTER — Ambulatory Visit (INDEPENDENT_AMBULATORY_CARE_PROVIDER_SITE_OTHER): Payer: Self-pay | Admitting: GENERAL

## 2022-12-29 NOTE — Assessment & Plan Note (Signed)
Continue fluoxetine 40 mg once daily.     Orders:    FLUoxetine (PROZAC) 40 mg Oral Capsule; Take 1 Capsule (40 mg total) by mouth Once a day

## 2022-12-29 NOTE — Assessment & Plan Note (Signed)
Lab Results   Component Value Date    CHOLESTEROL 215 (H) 04/01/2022    HDLCHOL 84 04/01/2022    LDLCHOL 125 (H) 04/01/2022    TRIG 36 04/01/2022

## 2022-12-30 ENCOUNTER — Ambulatory Visit (INDEPENDENT_AMBULATORY_CARE_PROVIDER_SITE_OTHER): Payer: Self-pay | Admitting: GENERAL

## 2023-01-05 ENCOUNTER — Ambulatory Visit (INDEPENDENT_AMBULATORY_CARE_PROVIDER_SITE_OTHER): Payer: Self-pay | Admitting: PHYSICAL MEDICINE AND REHABILITATION

## 2023-01-16 ENCOUNTER — Encounter (INDEPENDENT_AMBULATORY_CARE_PROVIDER_SITE_OTHER): Payer: Self-pay | Admitting: GENERAL

## 2023-01-17 ENCOUNTER — Ambulatory Visit
Admission: RE | Admit: 2023-01-17 | Discharge: 2023-01-17 | Disposition: A | Discharge status: Home / Self Care | Payer: Self-pay | Source: Ambulatory Visit

## 2023-01-18 ENCOUNTER — Ambulatory Visit (HOSPITAL_COMMUNITY): Payer: Self-pay

## 2023-01-19 ENCOUNTER — Ambulatory Visit (HOSPITAL_COMMUNITY): Payer: Self-pay

## 2023-01-27 NOTE — OR PreOp (Signed)
Patient given 1300 arrival time for 01/28/23.

## 2023-01-28 ENCOUNTER — Encounter (HOSPITAL_COMMUNITY)
Admission: RE | Disposition: A | Discharge status: Home / Self Care | Payer: Self-pay | Source: Ambulatory Visit | Attending: PHYSICAL MEDICINE AND REHABILITATION

## 2023-01-28 ENCOUNTER — Encounter (HOSPITAL_COMMUNITY): Payer: Self-pay | Admitting: PHYSICAL MEDICINE AND REHABILITATION

## 2023-01-28 ENCOUNTER — Ambulatory Visit (HOSPITAL_COMMUNITY)

## 2023-01-28 ENCOUNTER — Ambulatory Visit
Admission: RE | Admit: 2023-01-28 | Discharge: 2023-01-28 | Disposition: A | Discharge status: Home / Self Care | Source: Ambulatory Visit | Attending: PHYSICAL MEDICINE AND REHABILITATION | Admitting: PHYSICAL MEDICINE AND REHABILITATION

## 2023-01-28 ENCOUNTER — Encounter (INDEPENDENT_AMBULATORY_CARE_PROVIDER_SITE_OTHER): Payer: Self-pay | Admitting: PHYSICAL MEDICINE AND REHABILITATION

## 2023-01-28 DIAGNOSIS — M533 Sacrococcygeal disorders, not elsewhere classified: Secondary | ICD-10-CM | POA: Insufficient documentation

## 2023-01-28 DIAGNOSIS — G8929 Other chronic pain: Secondary | ICD-10-CM | POA: Insufficient documentation

## 2023-01-28 HISTORY — DX: Pure hypercholesterolemia, unspecified: E78.00

## 2023-01-28 SURGERY — BLOCK INJECTION SACRO-ILIAC
Anesthesia: Local (Nurse-Monitored) | Site: Back | Laterality: Left | Wound class: Clean Wound: Uninfected operative wounds in which no inflammation occurred

## 2023-01-28 MED ORDER — IOHEXOL 300 MG IODINE/ML INTRAVENOUS SOLUTION
Freq: Once | INTRAVENOUS | Status: DC | PRN
Start: 2023-01-28 — End: 2023-01-28
  Administered 2023-01-28: 3 mL via INTRAMUSCULAR

## 2023-01-28 MED ORDER — TRIAMCINOLONE ACETONIDE 40 MG/ML SUSPENSION FOR INJECTION
INTRAMUSCULAR | Status: AC
Start: 2023-01-28 — End: 2023-01-28
  Filled 2023-01-28: qty 2

## 2023-01-28 MED ORDER — TRIAMCINOLONE ACETONIDE 40 MG/ML SUSPENSION FOR INJECTION
Freq: Once | INTRAMUSCULAR | Status: DC | PRN
Start: 2023-01-28 — End: 2023-01-28
  Administered 2023-01-28: 40 mg via INTRAMUSCULAR

## 2023-01-28 MED ORDER — LIDOCAINE (PF) 10 MG/ML (1 %) INJECTION SOLUTION
Freq: Once | INTRAMUSCULAR | Status: DC | PRN
Start: 2023-01-28 — End: 2023-01-28
  Administered 2023-01-28: 3.5 mL via INTRAMUSCULAR

## 2023-01-28 SURGICAL SUPPLY — 4 items
GLOVE SURG 7.5 LF  PF BEAD CUF STRL CRM 11.8IN PROTEXIS PI PLISPRN THK9.1 MIL (GLOVES AND ACCESSORIES) ×1 IMPLANT
NEEDLE SPINAL BLK 3.5IN 22GA QUINCKE REG WL POLYPROP QUINCKE TIP STRL LF  DISP (MED SURG SUPPLIES) ×2 IMPLANT
TRAY PAIN MGMT 1 VIAL 30280_20/CS (CUSTOM TRAYS & PACK) ×1 IMPLANT
head cradle foam positioner (MED SURG SUPPLIES) ×1 IMPLANT

## 2023-01-28 NOTE — OR Surgeon (Signed)
[facility]  Interventional Spine   Procedure Note    Medical Office Building [facility]  [address] [place],  [place] [address]    DATE:01/28/23     Patient:  Catherine Colon   DOB : [birth year]      PROCEDURES PERFORMED:  Diagnotic left intraarticular sacroiliac joint injection.    REASON FOR PROCEDURES:  Chronic left sacroiliac joint pain    PHYSICIAN:  Delmar Landau, MD     PROCEDURE:  The patient is here for Diagnostic Left intraarticular sacroiliac joint injection with chronic sacroiliac joint  pain of  7/10 pain.     Risks and benefits of procedure explained to the patient, including off label use of corticosteroids in the epidural space if epidural injections are a part of the procedures.  Our impression, treatment recommendations and plan from today's visit were reviewed in detail with the patient in the office.  The procedure was explained using a spine  and /or joint model,  including the associated risks such as but not limited to pain at the injection site, local skin color changes, infection, bleeding, nerve irritation, nerve damage, paralysis, short-term or prolonged worsening of the pain, reaction to medications administered and failure of the pain to improve, risks for seizures, stroke, brain damage, cardiopulmonary complications, organ punctures/injuries, and/or death.  If hip joint  injection was planned, we also dicussed about possibility of avascular necrosis if steroid is to be injected into a hip joint space.  If bursa injections were discussed, there may be rare risk of ruptures and associated pain induced by injections. We also discussed about complications and risks of musculoskeletal  and vision changes such as cataract,  increased blood glutose levels and associated complications if the patient had diabetes, with steroid injections. If the patient became pregnant we must to be notified immediately and most cancel all procedures due to risk of abortion or risk to the fetus due  to radiation exposure.  All of the patients questions were answered. The patient is in agreement with our plan. The patient also agreed to follow our COVID 19 screening protocols. The patient must notify us for flu-like symptoms anytime before the procedure or office visit and the procedure may be cancelled. Then the patient may be directed for further screening.  As recommended by currently guideline set forth by  Spine Interventional Society, we would try to avoid elective steroid injection(s) 2 weeks prior to COVID vaccination and 1 week after the vaccination.  The patient agreed to notify to change our procedure schedule accordingly. The patient expressed understanding and agreed with all the above.   The patient signed the consent in chart.       After obtaining informed consent, the patient was placed in the prone position on the fluoroscopy table.  A time out was held for patient verification.  Cycled one minute blood pressure, pulse, and pulse oximetry was maintained.  The skin was prepped with chlorhexidine twice and dried and covered with a sterile sheet. Conscious sedation was not administered.      After raising a skin wheal with 1 percent Xylocaine and anesthetizing the subcutaneous tissues, a #22 gauge 3.5-inch spinal needle was placed under fluoroscopic control into the inferior pole of the Left sacroiliac joint.      The needle was withdrawn safely.    A total of 3-4 mL's of Omnipaque 300 contrast dye was injected, demonstrating intraarticular spread.    A total of 0.5 milliliters of 2  percent Xylocaine and 1 mL of 40mg /mL kenalog    was instilled into the sacroiliac joint.    Then the need was withdrawn safely.      The patient tolerated the procedure well and was transitioned to the post procedure holding area uneventfully.    The patient reported 100 percent improvement in their index sacroiliac joint pain from 7/10 to 0/10 within 30  min after the procedure.    The patient was given a pain  diagram and agreed to record the pain levels for the next 24 hours and to bring back the diagram to our office.      The patient was monitored for adverse allergic, paralytic and hypertensive reactions.  The patient was discharged without complication.      Exam performed by:   Delmar Landau, MD,  MD   01/28/2023, 14:29   Interventional Spine and Pain   [facility]

## 2023-01-28 NOTE — Nurses Notes (Signed)
1445 Patient discharged home with spouse.  AVS reviewed with patient.  A written copy of the AVS and discharge instructions was given to the patient/care giver.  Questions sufficiently answered as needed.  Patient/care giver encouraged to follow up with PCP as indicated.  In the event of an emergency, patient/care giver instructed to call 911 or go to the nearest emergency room.

## 2023-01-28 NOTE — H&P (Signed)
[facility] [employer] [facility]   Dept. Physical Medicine and Rehabilitation   Interventional Spine and Pain   [address]  [place], [place] [address]   [phone]      Name: Catherine Colon MRN:  [id number]   Date: 12/23/2022 Age: 33 y.o.   DOB: 1990-06-13     Today I had a pleasure of evaluating Catherine Colon in our office. I performed direct patient interview,  performed thorough targeted musculoskeletal and spine physical exam as well as  reviewed data collected by intake form.    As you know the patient is pleasant 33 y.o. female  with chief complaint of No chief complaint on file.Marland Kitchen          --------------------------------------------------------------------------------------------------------------------  Initial intake and H&P on : 12/22/2022    CC: No chief complaint on file..     The patient was referred by Catherine Landau, MD for LBP .    The patient reports left paramidline lumbosacral pain that began in April 07, 2022 without any inciting injury.  The pain gradually worsened.    The pain is worsened during her period and during ovulation.     It is a 4-10 in severity on a scale from 0-10.  It is currently a 6/10 in severity.  It is described as crushing, constant    Associated symptoms:  Numbness: One occurrence where she was having temporary numbness in the left leg when running.  Weakness: Denies.   Bowel/Bladder Incontinence: Bladder incontinence - She states it "does not feel like she needs to go until she really needs to go" Some leaking with coughing or sneezing.       Aggravating factors:  Walking, lifting, bending, lying  Alleviating factors:  Staying still, heat    The patient denies acute focal neurological deficits, HA, AMS, chest pain, SOB, bowel or bladder incontinence.  Denies saddle anesthesia.     Prior interventions:  Physical therapy: Patient is currently in physical therapy - with pelvic floor physical therapy an  also focusing on the back without much relief yet - she is  currently 2 months into treatment.   Oral Medications: Ibuprofen, Advil and Tylenol.  Injections: Denies.   Surgery: Denies any spine or joint surgeries.  Acupuncture or Other: Heating pad - with some temporary relief     Occupational/Personal History: Currently working as Herbalist, right hand dominant     Pertinent comorbidities: Generalized anxiety disorder previously on Prozac or Wellbutrin and she has since stopped taking these.     Last HgA1C:   Lab Results   Component Value Date    HA1C 5.3 09/08/2021          --------------------------------------------------------------------------------------------------------------------  Last seen: Visit date not found  Follow up : 01/28/2023  CC: No chief complaint on file.    Patient presents to [facility] for LEFT sacroiliac joint injection under fluroscopic guidance.    The patient denies new symptoms since last seen in the office.    Denies new radicular pain, paresthesia, or focal weakness of BL upper and lower limbs   All questions answered  Agreed to move forward with the procedures.   The patient expressed understanding of risks and signed the consent.      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      Plan:  Orders Placed This Encounter    FLUORO C ARM IN OR < 60 MINS       We discussed extensively about DDx and Tx options. Agreed with the following:   The patient has completed >6 weeks of formal physical therapy and then transitioned to physician guided home exercise program without sustained  relief.  Recommended the patient follow up Urology for other causes urinary incontinence  XR and MRI of the lumbosacral spine was reviewed with the patient   Proceed with left sacroiliac joint injection.    If no sustained relief may consider left L3 and L4 medial branch and L5 dorsal ramus block, followed by 2nd diagnostic, followed by radiofrequency ablation if positive  No follow-ups on file.  No LOS data to display  , including review of historical information, examination, documentation , and post-visit activities. Models were shown in the office to provide clear understanding and  course of action.  All question posed to me were answered in fullest and good and full understanding of these answers were achieved.       --------------------------------------------------------------------------------------------------------------------  Radiology and EMG-NCS  review:   I personally reviewed following images and integrated my personal dictations with the radiology reports (if available):    XR LUMBOSACRAL SERIES W FLEX EXT performed on 08/09/2022 7:50 AM.     REASON FOR EXAM:  M54.50: Low back pain     TECHNIQUE: 7 views/7 images submitted for interpretation.     COMPARISON:  None available     FINDINGS:  There is normal bony mineralization. There is normal bony alignment. Vertebral body heights and disc heights are maintained. No fractures are identified. No concerning subluxation is noted. There is no concerning hypermobility on the flexion and extension views.     IMPRESSION:  No concerning findings.    No results found for this or any previous visit (from the past 841324401 hour(s)).    No results found for this or any previous visit (from the past [id number] hour(s)).    Recent Results (from the past [id number] hour(s))   MRI SPINE LUMBOSACRAL WO CONTRAST    Collection Time: 08/30/22 10:54 AM    Narrative    Catherine Colon  Female, 33 years old.    MRI SPINE LUMBOSACRAL WO CONTRAST performed on 08/30/2022 10:54  AM.    REASON FOR EXAM:  M54.50: Low back pain    COMPARISON: Lumbar spine series dated August 09, 2022    TECHNIQUE: Lumbar spine MRI with multiplanar multi sequential imaging obtained without contrast    FINDINGS:    There is normal alignment. Vertebral body heights and disc heights are maintained. No fractures are identified. No concerning subluxation is noted. There is no evidence of pathologic infiltration of the bone marrow. There is no evidence abnormal T2 signal seen within the substance of the spinal cord. The conus medullaris terminates at the normal expected anatomical level. The central bony canal and neuroforamen appear patent at all lumbar levels.      Impression    No concerning findings.      Radiologist location ID: [id number]             --------------------------------------------------------------------------------------------------------------------  Allergies:: No Known Allergies    Past Medical History  No current outpatient medications on file.     Past  Medical History:   Diagnosis Date    Anxiety     Hypercholesterolemia          Family Medical History:       Problem Relation (Age of Onset)    Heart Attack Paternal Grandfather    High Cholesterol Father, Sister    Stroke Maternal Grandmother            Social History     Socioeconomic History    Marital status: Married   Tobacco Use    Smoking status: Never    Smokeless tobacco: Never   Vaping Use    Vaping status: Never Used   Substance and Sexual Activity    Alcohol use: Not Currently     Comment: very rare    Drug use: Never      Patient Active Problem List   Diagnosis    Dyslipidemia    GAD (generalized anxiety disorder)    Chronic left-sided low back pain without sciatica    Sacroiliac joint dysfunction of left side    Urinary incontinence    Urine frequency    Urinary urgency       Review of system:  There are no exam notes on file for this visit.  Pain Management  -POSITIVE SYMPTOMS:       No data to display                   I have  reviewed and agree with review of systems as documented by the clinical staff.  The pertinent positive and negative symptoms are as per HPI. All other systems reviewed and are negative.      --------------------------------------------------------------------------------------------------------------------     Physical Examination (POS =  Positive  NEG = Negative, BL = bilateral  )  General Vitals: BP 127/77   Pulse 63   Temp 36.5 C (97.7 F)   Resp 18   Ht 1.626 m (5\' 4" )   Wt 76.2 kg (168 lb 1.6 oz)   LMP 12/31/2022   SpO2 100%   Breastfeeding No   BMI 28.85 kg/m       Constitutional: Patient in no acute distress. Well-nourished female.   Mental Status: Orientation to person, place, problem, and time. Appropriate behavior,  mood,  and congruent affect. Good eye contact,   no aberrant psychomotor activity. Thought process appropriate:  no tangentiality/ circumstantiality, no perseverative thoughts or flight of ideas, and no redirection needed. Good insight.  HEENT:  Normocephalic.  Atraumatic.  No facial asymmetry.  Hears well bilateral with normal speech production.  Mucous membranes moist.  Conjunctiva clear.  No nuchal tenderness.   Respiration: normal rate and non-labored. No shortness of breath with muscle testing.  No dyspnea with exertion or with talking.  Equal air exchange.    CVS: : No JVD. Warm extremities bilaterally, no ankle edema  Abdomen:  Soft, non-tender, no masses, no guarding or rebound.    Skin: no cyanosis, jaundice, lesions, rashes except: none    Neurologic:  Alert oriented x4.  Speech and affect appropriate.  Speech output appropriate.   Cognition clear.  Memory  intact.  Cranial nerves 2-12 grossly intact. Fine motor control appropriate.  No tremors  Sensory exam: intact in all 4 limbs proximally and distally to light touch.  Motor Exam:  Manual muscle testing at available range of motions    Muscle/Myotome RUE LUE   Shoulder Abduction C5 5/5 5/5   Elbow Flexion C5-C6 5/5 5/5    Elbow Extension  C6-C7 5/5 5/5   Hand Grip C8 5/5 5/5   Finger Abduction T1 5/5 5/5      Muscle/Myotome RLE LLE   Hip Flexion L2-L3 5/5 5/5   Knee Extension L3-L4 5/5 5/5   Ankle Dorsiflexion L4-L5 5/5 5/5   Great Toe Extension L5 5/5 5/5   Knee Flexion L5-S1 5/5 5/5   Ankle Plantar Flexion S1-S2 5/5 5/5     No give way weakness noted    Reflexes:   Right: biceps 2/4, brachial radialis 2/4, triceps 2/4, patellar 2/4, and achilles 2/4.   Left: biceps 2/4, brachial radialis 2/4, triceps 2/4, patellar 2/4, and achilles 2/4.   Hoffmans Reflex Right: absent.  Hoffmans Reflex Left: absent.  Negative ankle clonus bilaterally.   Negative Babinski and  toes are down going bilaterally.    Musculoskeletal:    Girth:  Muscle definition is appropriate throughout.  Both upper and lower limb muscle bulk and tone are symmetric.  No soft tissue masses.  Joints:  Peripheral joint range of motion within normal limits in the head, neck, trunk and all 4 limbs without evidence of instability subluxation, effusion, or crepitus except otherwise noted below:  Spine:   Cervical Spine and thoracic spine: FROM and no tenderness with no spasm  Spurling's maneuver: Negative bilaterally.    Lumbosacral spine: Mildly  diminished range of motion with mild tenderness  with musucle tightness and spasm  No palpable step-offs.    Fist percussion sign: negative over the midline spine and sacroiliac joints.   Sustain hip flexion: Positive .   Straight leg raising: Deferred  Kemp's test:  POS  on the left at L45 L5S1 areas     SIJ joints:  4-6 positive on the left with pinpoint tenderness over the left sacroiliac joint.  Luisa Hart: Positive left   Distraction on ASIS: Negative bilaterally   Compression: Positive left   Thigh thrust: Negative bilaterally   Gaenslen: Positive left    Yeoman's: Positive left      Hips: Full ROM  Reproducible pain with Internal rotation / log roll: NEG BL  Reproducible pain with External rotation : NEG BL   Tenderness over the  gluteal muscles and tendons:  NEG BL   Reproducible pain over the greater trochanter: Negative bilaterally  Ober's test: NEG BL          Gait/Posture:   Stable without assistive device ( cane, etc) , no lurching gait during stance phase. Gets up from a seated position with some difficulty due to pain, maintains kyphotic posture due to LBP       --------------------------------------------------------------------------------------------------------------------  Our impression, treatment recommendations and plan from today's visit were reviewed in detail with the patient in the office using a model. The patient was given the opportunity to ask questions and those questions were answered to the patient's satisfaction. The patient was encouraged to call with any additional questions or concerns. Instructed patient to call back if symptoms do not improve.         Thank you for allowing me to participate in the plan of care of this patient.  If you have any further questions, please do not hesitate to contact me at my office.    Catherine Colon, M.D.  Interventional Spine Care  [facility]     Portions of this note may be dictated using voice recognition software or a dictation service. Variances in spelling and vocabulary are possible and unintentional. Not all errors are caught/corrected.  Please notify the Thereasa Parkin if any discrepancies are noted or if the meaning of any statement is not clear.

## 2023-01-31 ENCOUNTER — Encounter (INDEPENDENT_AMBULATORY_CARE_PROVIDER_SITE_OTHER): Payer: Self-pay | Admitting: PHYSICAL MEDICINE AND REHABILITATION

## 2023-01-31 ENCOUNTER — Ambulatory Visit: Attending: PHYSICAL MEDICINE AND REHABILITATION | Admitting: PHYSICAL MEDICINE AND REHABILITATION

## 2023-01-31 ENCOUNTER — Other Ambulatory Visit: Payer: Self-pay

## 2023-01-31 VITALS — BP 132/85 | HR 60 | Ht 64.0 in | Wt 170.3 lb

## 2023-01-31 DIAGNOSIS — M47816 Spondylosis without myelopathy or radiculopathy, lumbar region: Secondary | ICD-10-CM | POA: Insufficient documentation

## 2023-01-31 DIAGNOSIS — M533 Sacrococcygeal disorders, not elsewhere classified: Secondary | ICD-10-CM | POA: Insufficient documentation

## 2023-01-31 DIAGNOSIS — M47817 Spondylosis without myelopathy or radiculopathy, lumbosacral region: Secondary | ICD-10-CM | POA: Insufficient documentation

## 2023-01-31 DIAGNOSIS — G8929 Other chronic pain: Secondary | ICD-10-CM | POA: Insufficient documentation

## 2023-01-31 DIAGNOSIS — M545 Low back pain, unspecified: Secondary | ICD-10-CM | POA: Insufficient documentation

## 2023-01-31 NOTE — Progress Notes (Signed)
[facility]   Dept. Physical Medicine and Rehabilitation   Interventional Spine and Pain   [address]  [place], [place] [address]   [phone]      Name: Catherine Colon MRN:  [id number]   Date: 01/31/2023 Age: 33 y.o.   DOB: 12/27/1990     Today I had a pleasure of evaluating Catherine Colon in our office. I performed direct patient interview,  performed thorough targeted musculoskeletal and spine physical exam as well as  reviewed data collected by intake form.    As you know the patient is pleasant 33 y.o. female  with chief complaint of Follow Up After Injection  (01/28/23 - Diagnotic left intraarticular sacroiliac joint injection.) and Sacroilitis.          --------------------------------------------------------------------------------------------------------------------  Initial intake and H&P on : 12/22/2022    CC: Follow Up After Injection  (01/28/23 - Diagnotic left intraarticular sacroiliac joint injection.) and Sacroilitis.     The patient was referred by Jacinta Shoe, DO for LBP .    The patient reports left paramidline lumbosacral pain that began in April 07, 2022 without any inciting injury.  The pain gradually worsened.    The pain is worsened during her period and during ovulation.     It is a 4-10 in severity on a scale from 0-10.  It is currently a 6/10 in severity.  It is described as crushing, constant    Associated symptoms:  Numbness: One occurrence where she was having temporary numbness in the left leg when running.  Weakness: Denies.   Bowel/Bladder Incontinence: Bladder incontinence - She states it "does not feel like she needs to go until she really needs to go" Some leaking with coughing or sneezing.       Aggravating factors:  Walking, lifting, bending, lying  Alleviating factors:  Staying still, heat    The patient denies acute focal neurological deficits, HA, AMS, chest pain, SOB, bowel or bladder incontinence.  Denies saddle anesthesia.     Prior  interventions:  Physical therapy: Patient is currently in physical therapy - with pelvic floor physical therapy an  also focusing on the back without much relief yet - she is currently 2 months into treatment.   Oral Medications: Ibuprofen, Advil and Tylenol.  Injections: Denies.   Surgery: Denies any spine or joint surgeries.  Acupuncture or Other: Heating pad - with some temporary relief     Occupational/Personal History: Currently working as Herbalist, right hand dominant     Pertinent comorbidities: Generalized anxiety disorder previously on Prozac or Wellbutrin and she has since stopped taking these.     Last HgA1C:   Lab Results   Component Value Date    HA1C 5.3 09/08/2021          --------------------------------------------------------------------------------------------------------------------  Last seen: 12/22/2022  Follow up : 01/31/2023  CC: Follow Up After Injection  (01/28/23 - Diagnotic left intraarticular sacroiliac joint injection.) and Sacroilitis    The patient returns to the clinic for continued management of left paramidline lumbosacral pain.     There has been no significant changes to the character of her condition. Patient denies any new numbness, weakness or bowel or bladder incontinence.     It is a 6 in severity on a scale from 0-10.     Interventions since previous visit: s/p Block Injection Sacro-iliac - Left on 01/28/2023 after which she had initial worsening of her pain but it has now returned to baseline - discussed  that the onset of the steroid effect can be anywhere from 3 to 7 days so we would followup on 1/27 as previously scheduled - If there is no significant relief would consider a left L3 and L4 medial branch and L5 dorsal ramus block followed by RFA if positive.     Diagnostic studies completed since previous visit: None.    The patient denies acute focal neurological deficits, HA, AMS, chest pain, SOB, bowel or bladder incontinence. Denies saddle anesthesia.     Last  HgA1C:   Lab Results   Component Value Date    HA1C 5.3 09/08/2021        --------------------------------------------------------------------------------------------------------------------    Impression:   Chronic left axial low back pain consistent with sacroiliac joint pathology vs myofascial pain with trigger points vs  painful facet arthrosis at  L5-S1 v vs  internal disc disruptions at L5S1 vs L45   History of generalized anxiety disorder      Procedure Summary:  None      ENCOUNTER DIAGNOSES     ICD-10-CM   1. Chronic left-sided low back pain without sciatica  M54.50    G89.29   2. 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 Lumbar spondylosis  M47.816   4. Facet arthropathy, lumbosacral  M47.817         Plan:  No orders of the defined types were placed in this encounter.      We discussed extensively about DDx and Tx options. Agreed with the following:   The patient has completed >6 weeks of formal physical therapy and then transitioned to physician guided home exercise program without sustained relief.  Recommended the patient follow up Urology for other causes urinary incontinence  XR and MRI of the lumbosacral spine was reviewed with the patient   s/p Block Injection Sacro-iliac - Left on 01/28/2023 after which she had initial worsening of her pain but it has now returned to baseline - discussed that the onset of the steroid effect can be anywhere from 3 to 7 days so we would followup on 1/27 as previously scheduled - If there is no significant relief would consider a left L3 and L4 medial branch and L5 dorsal ramus block followed by RFA if positive.   Return for Followup on 1/27 as previously scheduled .  I spent a total of 30 minutes on the day of the visit.  , including review of historical information, examination, documentation , and post-visit activities. Models were shown in the office to provide clear understanding and  course of action.  All question posed to me were answered in fullest  and good and full understanding of these answers were achieved.       --------------------------------------------------------------------------------------------------------------------  Radiology and EMG-NCS  review:   I personally reviewed following images and integrated my personal dictations with the radiology reports (if available):    XR LUMBOSACRAL SERIES W FLEX EXT performed on 08/09/2022 7:50 AM.     REASON FOR EXAM:  M54.50: Low back pain     TECHNIQUE: 7 views/7 images submitted for interpretation.     COMPARISON:  None available     FINDINGS:  There is normal bony mineralization. There is normal bony alignment. Vertebral body heights and disc heights are maintained. No fractures are identified. No concerning subluxation is noted. There is no concerning hypermobility on the flexion and extension views.     IMPRESSION:  No concerning findings.    No results found for this  or any previous visit (from the past [id number] hour(s)).    No results found for this or any previous visit (from the past [id number] hour(s)).    Recent Results (from the past [id number] hour(s))   MRI SPINE LUMBOSACRAL WO CONTRAST    Collection Time: 08/30/22 10:54 AM    Narrative    Nilam Legate  Female, 33 years old.    MRI SPINE LUMBOSACRAL WO CONTRAST performed on 08/30/2022 10:54 AM.    REASON FOR EXAM:  M54.50: Low back pain    COMPARISON: Lumbar spine series dated August 09, 2022    TECHNIQUE: Lumbar spine MRI with multiplanar multi sequential imaging obtained without contrast    FINDINGS:    There is normal alignment. Vertebral body heights and disc heights are maintained. No fractures are identified. No concerning subluxation is noted. There is no evidence of pathologic infiltration of the bone marrow. There is no evidence abnormal T2 signal seen within the substance of the spinal cord. The conus medullaris terminates at the normal expected anatomical level. The central bony canal and neuroforamen appear patent at all lumbar  levels.      Impression    No concerning findings.      Radiologist location ID: [id number]             --------------------------------------------------------------------------------------------------------------------  Allergies:: No Known Allergies    Past Medical History  Current Outpatient Medications   Medication Sig    rosuvastatin (CRESTOR) 20 mg Oral Tablet TAKE 1 TABLET BY MOUTH EVERY DAY IN THE EVENING     Past Medical History:   Diagnosis Date    Anxiety     Hypercholesterolemia     Low back pain          Family Medical History:       Problem Relation (Age of Onset)    Heart Attack Paternal Grandfather    High Cholesterol Father, Sister    Stroke Maternal Grandmother            Social History     Socioeconomic History    Marital status: Married   Tobacco Use    Smoking status: Never    Smokeless tobacco: Never   Vaping Use    Vaping status: Never Used   Substance and Sexual Activity    Alcohol use: Not Currently     Comment: very rare    Drug use: Never      Patient Active Problem List   Diagnosis    Dyslipidemia    GAD (generalized anxiety disorder)    Chronic left-sided low back pain without sciatica    Sacroiliac joint dysfunction of left side    Urinary incontinence    Urine frequency    Urinary urgency       Review of system:  Nursing Notes:   Elmore Guise, Registered Medical Assistant  01/31/23 1433  Signed     01/31/23 1433   Domestic Violence   Because we are aware of abuse and domestic violence today, we ask all patients: Are you being hurt, hit, or frightened by anyone at your home or in your life?  N   Basic Needs   Do you have any basic needs within your home that are not being met? (such as Food, Shelter, [employer] fast streamer, Tranportation, paying for bills and/or medications) N     Elmore Guise, Registered Medical Assistant      Elmore Guise, Registered Medical Assistant  01/31/23 1435  Signed     01/31/23 1400  Oswestry LBP    I have "Chronic Pain" or pain that has bothered me for 3  months or more Yes   Questionnaire Completed Prior to Surgery   Pain Intensity  2   Personal Care 0   Lifting 1   Walking 1   Sitting 0   Standing 1   Sleeping 1   Social Life 2   Traveling 1   Employment/Homemaking 0   ODI Total Score Oswestry LBP 9   ODI % 18   ODI Scoring % Interpretation   Oswestry Disability Scoring % Interpretation Minimal Disability     Elmore Guise, Registered Medical Assistant    Pain Management  -POSITIVE SYMPTOMS:       No data to display                   I have reviewed and agree with review of systems as documented by the clinical staff.  The pertinent positive and negative symptoms are as per HPI. All other systems reviewed and are negative.      --------------------------------------------------------------------------------------------------------------------     Physical Examination (POS =  Positive  NEG = Negative, BL = bilateral  )  General Vitals: BP 132/85   Pulse 60   Ht 1.626 m (5\' 4" )   Wt 77.2 kg (170 lb 4.8 oz)   LMP 12/31/2022   BMI 29.23 kg/m       Constitutional: Patient in no acute distress. Well-nourished female.   Mental Status: Orientation to person, place, problem, and time. Appropriate behavior,  mood,  and congruent affect. Good eye contact,   no aberrant psychomotor activity. Thought process appropriate:  no tangentiality/ circumstantiality, no perseverative thoughts or flight of ideas, and no redirection needed. Good insight.  HEENT:  Normocephalic.  Atraumatic.  No facial asymmetry.  Hears well bilateral with normal speech production.  Mucous membranes moist.  Conjunctiva clear.  No nuchal tenderness.   Respiration: normal rate and non-labored. No shortness of breath with muscle testing.  No dyspnea with exertion or with talking.  Equal air exchange.    CVS: : No JVD. Warm extremities bilaterally, no ankle edema  Abdomen:  Soft, non-tender, no masses, no guarding or rebound.    Skin: no cyanosis, jaundice, lesions, rashes except: none    Neurologic:   Alert oriented x4.  Speech and affect appropriate.  Speech output appropriate.   Cognition clear.  Memory  intact.  Cranial nerves 2-12 grossly intact. Fine motor control appropriate.  No tremors  Sensory exam: intact in all 4 limbs proximally and distally to light touch.  Motor Exam:  Manual muscle testing at available range of motions    Muscle/Myotome RUE LUE   Shoulder Abduction C5 5/5 5/5   Elbow Flexion C5-C6 5/5 5/5   Elbow Extension C6-C7 5/5 5/5   Hand Grip C8 5/5 5/5   Finger Abduction T1 5/5 5/5      Muscle/Myotome RLE LLE   Hip Flexion L2-L3 5/5 5/5   Knee Extension L3-L4 5/5 5/5   Ankle Dorsiflexion L4-L5 5/5 5/5   Great Toe Extension L5 5/5 5/5   Knee Flexion L5-S1 5/5 5/5   Ankle Plantar Flexion S1-S2 5/5 5/5     No give way weakness noted    Reflexes:   Right: biceps 2/4, brachial radialis 2/4, triceps 2/4, patellar 2/4, and achilles 2/4.   Left: biceps 2/4, brachial radialis 2/4, triceps 2/4, patellar 2/4, and achilles 2/4.   Hoffmans Reflex Right: absent.  Hoffmans Reflex  Left: absent.  Negative ankle clonus bilaterally.   Negative Babinski and  toes are down going bilaterally.    Musculoskeletal:    Girth:  Muscle definition is appropriate throughout.  Both upper and lower limb muscle bulk and tone are symmetric.  No soft tissue masses.  Joints:  Peripheral joint range of motion within normal limits in the head, neck, trunk and all 4 limbs without evidence of instability subluxation, effusion, or crepitus except otherwise noted below:  Spine:   Cervical Spine and thoracic spine: FROM and no tenderness with no spasm  Spurling's maneuver: Negative bilaterally.    Lumbosacral spine: Mildly  diminished range of motion with mild tenderness  with musucle tightness and spasm  No palpable step-offs.    Fist percussion sign: negative over the midline spine and sacroiliac joints.   Sustain hip flexion: Positive .   Straight leg raising: Deferred  Kemp's test:  POS  on the left at L45 L5S1 areas     SIJ  joints:  4/6 positive on the left with pinpoint tenderness over the left sacroiliac joint.  Luisa Hart: Positive left   Distraction on ASIS: Negative bilaterally   Compression: Positive left   Thigh thrust: Negative bilaterally   Gaenslen: Positive left    Yeoman's: Positive left      Hips: Full ROM  Reproducible pain with Internal rotation / log roll: NEG BL  Reproducible pain with External rotation : NEG BL   Tenderness over the gluteal muscles and tendons:  NEG BL   Reproducible pain over the greater trochanter: Negative bilaterally  Ober's test: NEG BL          Gait/Posture:   Stable without assistive device ( cane, etc) , no lurching gait during stance phase. Gets up from a seated position with some difficulty due to pain, maintains kyphotic posture due to LBP       --------------------------------------------------------------------------------------------------------------------  Our impression, treatment recommendations and plan from today's visit were reviewed in detail with the patient in the office using a model. The patient was given the opportunity to ask questions and those questions were answered to the patient's satisfaction. The patient was encouraged to call with any additional questions or concerns. Instructed patient to call back if symptoms do not improve.         Thank you for allowing me to participate in the plan of care of this patient.  If you have any further questions, please do not hesitate to contact me at my office.    Delmar Landau, M.D.  Interventional Spine Care  [facility]     Portions of this note may be dictated using voice recognition software or a dictation service. Variances in spelling and vocabulary are possible and unintentional. Not all errors are caught/corrected. Please notify the Thereasa Parkin if any discrepancies are noted or if the meaning of any statement is not clear.

## 2023-01-31 NOTE — Nursing Note (Signed)
01/31/23 1433   Domestic Violence   Because we are aware of abuse and domestic violence today, we ask all patients: Are you being hurt, hit, or frightened by anyone at your home or in your life?  N   Basic Needs   Do you have any basic needs within your home that are not being met? (such as Food, Shelter, [employer] fast streamer, Tranportation, paying for bills and/or medications) N     Elmore Guise, Forensic scientist

## 2023-01-31 NOTE — Nursing Note (Signed)
Pt called Dr Cyndie Chime office related to pain not subsiding or decreasing after the procedure. She has appt with him today at 1430. No questions regarding d/c instructions.

## 2023-01-31 NOTE — Nursing Note (Signed)
01/31/23 1400   Oswestry LBP    I have "Chronic Pain" or pain that has bothered me for 3 months or more Yes   Questionnaire Completed Prior to Surgery   Pain Intensity  2   Personal Care 0   Lifting 1   Walking 1   Sitting 0   Standing 1   Sleeping 1   Social Life 2   Traveling 1   Employment/Homemaking 0   ODI Total Score Oswestry LBP 9   ODI % 18   ODI Scoring % Interpretation   Oswestry Disability Scoring % Interpretation Minimal Disability     Elmore Guise, Forensic scientist

## 2023-02-03 DIAGNOSIS — M533 Sacrococcygeal disorders, not elsewhere classified: Secondary | ICD-10-CM

## 2023-02-14 ENCOUNTER — Ambulatory Visit (INDEPENDENT_AMBULATORY_CARE_PROVIDER_SITE_OTHER): Payer: Self-pay | Admitting: PHYSICAL MEDICINE AND REHABILITATION

## 2023-02-14 ENCOUNTER — Ambulatory Visit: Attending: PHYSICAL MEDICINE AND REHABILITATION | Admitting: PHYSICAL MEDICINE AND REHABILITATION

## 2023-02-14 ENCOUNTER — Encounter (INDEPENDENT_AMBULATORY_CARE_PROVIDER_SITE_OTHER): Payer: Self-pay | Admitting: PHYSICAL MEDICINE AND REHABILITATION

## 2023-02-14 ENCOUNTER — Other Ambulatory Visit: Payer: Self-pay

## 2023-02-14 VITALS — BP 112/76 | HR 66 | Ht 64.0 in | Wt 168.0 lb

## 2023-02-14 DIAGNOSIS — M47817 Spondylosis without myelopathy or radiculopathy, lumbosacral region: Secondary | ICD-10-CM | POA: Insufficient documentation

## 2023-02-14 DIAGNOSIS — M545 Low back pain, unspecified: Secondary | ICD-10-CM | POA: Insufficient documentation

## 2023-02-14 DIAGNOSIS — M533 Sacrococcygeal disorders, not elsewhere classified: Secondary | ICD-10-CM | POA: Insufficient documentation

## 2023-02-14 DIAGNOSIS — M47816 Spondylosis without myelopathy or radiculopathy, lumbar region: Secondary | ICD-10-CM | POA: Insufficient documentation

## 2023-02-14 DIAGNOSIS — G8929 Other chronic pain: Secondary | ICD-10-CM | POA: Insufficient documentation
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
## 2023-02-14 NOTE — Nursing Note (Signed)
02/14/23 1500   Oswestry LBP    I have "Chronic Pain" or pain that has bothered me for 3 months or more Yes   Questionnaire Completed Prior to Surgery   Pain Intensity  2   Personal Care 0   Lifting 1   Walking 1   Sitting 2   Standing 2   Sleeping 1   Social Life 2   Traveling 1   Employment/Homemaking 0   ODI Total Score Oswestry LBP 12   ODI % 24   ODI Scoring % Interpretation   Oswestry Disability Scoring % Interpretation Moderate Disability     Elmore Guise, Forensic scientist
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
## 2023-02-14 NOTE — Progress Notes (Signed)
[facility] [employer] [facility]   Dept. Physical Medicine and Rehabilitation   Interventional Spine and Pain   [address]  [place], [place] [address]   [phone]      Name: Catherine Colon MRN:  [id number]   Date: 02/14/2023 Age: 33 y.o.   DOB: 1991-01-14     Today I had a pleasure of evaluating Catherine Colon in our office. I performed direct patient interview,  performed thorough targeted musculoskeletal and spine physical exam as well as  reviewed data collected by intake form.    As you know the patient is pleasant 33 y.o. female  with chief complaint of Follow Up After Injection  (01/28/23 - Diagnotic left intraarticular sacroiliac joint injection.) and Low Back Pain (Left side/).          --------------------------------------------------------------------------------------------------------------------  Initial intake and H&P on : 12/22/2022    CC: Follow Up After Injection  (01/28/23 - Diagnotic left intraarticular sacroiliac joint injection.) and Low Back Pain (Left side/).     The patient was referred by No ref. provider found for LBP .    The patient reports left paramidline lumbosacral pain that began in April 07, 2022 without any inciting injury.  The pain gradually worsened.    The pain is worsened during her period and during ovulation.     It is a 4-10 in severity on a scale from 0-10.  It is currently a 6/10 in severity.  It is described as crushing, constant    Associated symptoms:  Numbness: One occurrence where she was having temporary numbness in the left leg when running.  Weakness: Denies.   Bowel/Bladder Incontinence: Bladder incontinence - She states it "does not feel like she needs to go until she really needs to go" Some leaking with coughing or sneezing.       Aggravating factors:  Walking, lifting, bending, lying  Alleviating factors:  Staying still, heat    The patient denies acute focal neurological deficits, HA, AMS, chest pain, SOB, bowel or bladder incontinence.  Denies  saddle anesthesia.     Prior interventions:  Physical therapy: Patient is currently in physical therapy - with pelvic floor physical therapy an  also focusing on the back without much relief yet - she is currently 2 months into treatment.   Oral Medications: Ibuprofen, Advil and Tylenol.  Injections: Denies.   Surgery: Denies any spine or joint surgeries.  Acupuncture or Other: Heating pad - with some temporary relief     Occupational/Personal History: Currently working as Herbalist, right hand dominant     Pertinent comorbidities: Generalized anxiety disorder previously on Prozac or Wellbutrin and she has since stopped taking these.     Last HgA1C:   Lab Results   Component Value Date    HA1C 5.3 09/08/2021          --------------------------------------------------------------------------------------------------------------------  Last seen: 01/31/2023  Follow up : 02/14/2023  CC: Follow Up After Injection  (01/28/23 - Diagnotic left intraarticular sacroiliac joint injection.) and Low Back Pain (Left side/)    The patient returns to the clinic for continued management of left paramidline lumbosacral pain.     There has been no significant changes to the character of her condition. Patient denies any new numbness, weakness or bowel or bladder incontinence.     It is a 6 in severity on a scale from 0-10.     Interventions since previous visit: s/p Block Injection Sacro-iliac - Left on 01/28/2023 with initial 0% reduction of  her index pain with now 0% sustained reduction as of 02/14/2023.     Diagnostic studies completed since previous visit: None.    The patient denies acute focal neurological deficits, HA, AMS, chest pain, SOB, bowel or bladder incontinence. Denies saddle anesthesia.     Last HgA1C:   Lab Results   Component Value Date    HA1C 5.3 09/08/2021           --------------------------------------------------------------------------------------------------------------------    Impression:   Chronic  left axial low back pain consistent with painful facet arthrosis at  L5-S1 vs L45 vs L34 sacroiliac joint pathology vs myofascial pain with trigger points vs   vs  internal disc disruptions at L5S1 vs L45   History of generalized anxiety disorder      Procedure Summary:  s/p Block Injection Sacro-iliac - Left on 01/28/2023 with initial worsening of her index pain with now 0% sustained reduction as of 02/14/2023.      ENCOUNTER DIAGNOSES     ICD-10-CM   1. Facet arthropathy, lumbosacral  M47.817   2. Chronic left-sided low back pain without sciatica  M54.50    G89.29   3. 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 Lumbar spondylosis  M47.816           Plan:  Orders Placed This Encounter    1st Diagnostic block of LEFT L3,L4 medial branches and L5 dorsal rami  at [facility]. Prior Auth/Scheduling Procedure    2nd  Diagnostic block of LEFT L3,L4 medial branches and L5 dorsal rami  at [facility]. Prior Auth/Scheduling Procedure       We discussed extensively about DDx and Tx options. Agreed with the following:   The patient has completed >6 weeks of formal physical therapy and then transitioned to physician guided home exercise program without sustained relief.  Recommended the patient follow up Urology for other causes urinary incontinence  XR and MRI of the lumbosacral spine was reviewed with the patient   Proceed with 1st and 2nd diagnostic left L3 and L4 medial branch and L5 dorsal ramus block, followed by radiofrequency ablation if positive  Return for Schedule for 1st and 2nd DX left L3 and L4 MBB and L5 DR block.  I spent a total of 42 minutes on the day of the visit.  , including review of historical information, examination, documentation , and post-visit activities. Models were shown in the office to provide clear understanding and  course of action.  All question posed to me were answered in fullest and good and full understanding of these answers were achieved.        --------------------------------------------------------------------------------------------------------------------  Radiology and EMG-NCS  review:   I personally reviewed following images and integrated my personal dictations with the radiology reports (if available):    XR LUMBOSACRAL SERIES W FLEX EXT performed on 08/09/2022 7:50 AM.     REASON FOR EXAM:  M54.50: Low back pain     TECHNIQUE: 7 views/7 images submitted for interpretation.     COMPARISON:  None available     FINDINGS:  There is normal bony mineralization. There is normal bony alignment. Vertebral body heights and disc heights are maintained. No fractures are identified. No concerning subluxation is noted. There is no concerning hypermobility on the flexion and extension views.     IMPRESSION:  No concerning findings.    No results found for this or any previous visit (from the past [id number] hour(s)).    No results found  for this or any previous visit (from the past [id number] hour(s)).    Recent Results (from the past [id number] hour(s))   MRI SPINE LUMBOSACRAL WO CONTRAST    Collection Time: 08/30/22 10:54 AM    Narrative    Catherine Colon  Female, 33 years old.    MRI SPINE LUMBOSACRAL WO CONTRAST performed on 08/30/2022 10:54 AM.    REASON FOR EXAM:  M54.50: Low back pain    COMPARISON: Lumbar spine series dated August 09, 2022    TECHNIQUE: Lumbar spine MRI with multiplanar multi sequential imaging obtained without contrast    FINDINGS:    There is normal alignment. Vertebral body heights and disc heights are maintained. No fractures are identified. No concerning subluxation is noted. There is no evidence of pathologic infiltration of the bone marrow. There is no evidence abnormal T2 signal seen within the substance of the spinal cord. The conus medullaris terminates at the normal expected anatomical level. The central bony canal and neuroforamen appear patent at all lumbar levels.      Impression    No concerning findings.      Radiologist  location ID: [id number]             --------------------------------------------------------------------------------------------------------------------  Allergies:: No Known Allergies    Past Medical History  Current Outpatient Medications   Medication Sig    rosuvastatin (CRESTOR) 20 mg Oral Tablet TAKE 1 TABLET BY MOUTH EVERY DAY IN THE EVENING     Past Medical History:   Diagnosis Date    Anxiety     Hypercholesterolemia     Low back pain          Family Medical History:       Problem Relation (Age of Onset)    Heart Attack Paternal Grandfather    High Cholesterol Father, Sister    Stroke Maternal Grandmother            Social History     Socioeconomic History    Marital status: Married   Tobacco Use    Smoking status: Never    Smokeless tobacco: Never   Vaping Use    Vaping status: Never Used   Substance and Sexual Activity    Alcohol use: Not Currently     Comment: very rare    Drug use: Never      Patient Active Problem List   Diagnosis    Dyslipidemia    GAD (generalized anxiety disorder)    Chronic left-sided low back pain without sciatica    Sacroiliac joint dysfunction of left side    Urinary incontinence    Urine frequency    Urinary urgency       Review of system:  Nursing Notes:   Elmore Guise, Registered Medical Assistant  02/14/23 1536  Signed     02/14/23 1536   Domestic Violence   Because we are aware of abuse and domestic violence today, we ask all patients: Are you being hurt, hit, or frightened by anyone at your home or in your life?  N   Basic Needs   Do you have any basic needs within your home that are not being met? (such as Food, Shelter, [employer] fast streamer, Tranportation, paying for bills and/or medications) N     Elmore Guise, Registered Medical Assistant      Elmore Guise, Registered Medical Assistant  02/14/23 1537  Signed     02/14/23 1500   Oswestry LBP    I have "Chronic Pain" or pain that has bothered  me for 3 months or more Yes   Questionnaire Completed Prior to Surgery   Pain  Intensity  2   Personal Care 0   Lifting 1   Walking 1   Sitting 2   Standing 2   Sleeping 1   Social Life 2   Traveling 1   Employment/Homemaking 0   ODI Total Score Oswestry LBP 12   ODI % 24   ODI Scoring % Interpretation   Oswestry Disability Scoring % Interpretation Moderate Disability     Elmore Guise, Registered Medical Assistant    Pain Management  -POSITIVE SYMPTOMS:       No data to display                   I have reviewed and agree with review of systems as documented by the clinical staff.  The pertinent positive and negative symptoms are as per HPI. All other systems reviewed and are negative.      --------------------------------------------------------------------------------------------------------------------     Physical Examination (POS =  Positive  NEG = Negative, BL = bilateral  )  General Vitals: BP 112/76   Pulse 66   Ht 1.626 m (5\' 4" )   Wt 76.2 kg (168 lb)   LMP 12/31/2022   BMI 28.84 kg/m       Constitutional: Patient in no acute distress. Well-nourished female.   Mental Status: Orientation to person, place, problem, and time. Appropriate behavior,  mood,  and congruent affect. Good eye contact,   no aberrant psychomotor activity. Thought process appropriate:  no tangentiality/ circumstantiality, no perseverative thoughts or flight of ideas, and no redirection needed. Good insight.  HEENT:  Normocephalic.  Atraumatic.  No facial asymmetry.  Hears well bilateral with normal speech production.  Mucous membranes moist.  Conjunctiva clear.  No nuchal tenderness.   Respiration: normal rate and non-labored. No shortness of breath with muscle testing.  No dyspnea with exertion or with talking.  Equal air exchange.    CVS: : No JVD. Warm extremities bilaterally, no ankle edema  Abdomen:  Soft, non-tender, no masses, no guarding or rebound.    Skin: no cyanosis, jaundice, lesions, rashes except: none    Neurologic:  Alert oriented x4.  Speech and affect appropriate.  Speech output  appropriate.   Cognition clear.  Memory  intact.  Cranial nerves 2-12 grossly intact. Fine motor control appropriate.  No tremors  Sensory exam: intact in all 4 limbs proximally and distally to light touch.  Motor Exam:  Manual muscle testing at available range of motions    Muscle/Myotome RUE LUE   Shoulder Abduction C5 5/5 5/5   Elbow Flexion C5-C6 5/5 5/5   Elbow Extension C6-C7 5/5 5/5   Hand Grip C8 5/5 5/5   Finger Abduction T1 5/5 5/5      Muscle/Myotome RLE LLE   Hip Flexion L2-L3 5/5 5/5   Knee Extension L3-L4 5/5 5/5   Ankle Dorsiflexion L4-L5 5/5 5/5   Great Toe Extension L5 5/5 5/5   Knee Flexion L5-S1 5/5 5/5   Ankle Plantar Flexion S1-S2 5/5 5/5     No give way weakness noted    Reflexes:   Right: biceps 2/4, brachial radialis 2/4, triceps 2/4, patellar 2/4, and achilles 2/4.   Left: biceps 2/4, brachial radialis 2/4, triceps 2/4, patellar 2/4, and achilles 2/4.   Hoffmans Reflex Right: absent.  Hoffmans Reflex Left: absent.  Negative ankle clonus bilaterally.   Negative Babinski and  toes are down  going bilaterally.    Musculoskeletal:    Girth:  Muscle definition is appropriate throughout.  Both upper and lower limb muscle bulk and tone are symmetric.  No soft tissue masses.  Joints:  Peripheral joint range of motion within normal limits in the head, neck, trunk and all 4 limbs without evidence of instability subluxation, effusion, or crepitus except otherwise noted below:  Spine:   Cervical Spine and thoracic spine: FROM and no tenderness with no spasm  Spurling's maneuver: Negative bilaterally.    Lumbosacral spine: Mildly  diminished range of motion with mild tenderness  with musucle tightness and spasm  No palpable step-offs.    Fist percussion sign: negative over the midline spine and sacroiliac joints.   Sustain hip flexion: Positive .   Straight leg raising: Deferred  Kemp's test:  POS  on the left at L45 L5S1 areas     SIJ joints:  4/6 positive on the left with pinpoint tenderness over the  left sacroiliac joint.  Luisa Hart: Positive left   Distraction on ASIS: Negative bilaterally   Compression: Positive left   Thigh thrust: Negative bilaterally   Gaenslen: Positive left    Yeoman's: Positive left      Hips: Full ROM  Reproducible pain with Internal rotation / log roll: NEG BL  Reproducible pain with External rotation : NEG BL   Tenderness over the gluteal muscles and tendons:  NEG BL   Reproducible pain over the greater trochanter: Negative bilaterally  Ober's test: NEG BL          Gait/Posture:   Stable without assistive device ( cane, etc) , no lurching gait during stance phase. Gets up from a seated position with some difficulty due to pain, maintains kyphotic posture due to LBP       --------------------------------------------------------------------------------------------------------------------  Our impression, treatment recommendations and plan from today's visit were reviewed in detail with the patient in the office using a model. The patient was given the opportunity to ask questions and those questions were answered to the patient's satisfaction. The patient was encouraged to call with any additional questions or concerns. Instructed patient to call back if symptoms do not improve.         Thank you for allowing me to participate in the plan of care of this patient.  If you have any further questions, please do not hesitate to contact me at my office.    Delmar Landau, M.D.  Interventional Spine Care  [facility]     Portions of this note may be dictated using voice recognition software or a dictation service. Variances in spelling and vocabulary are possible and unintentional. Not all errors are caught/corrected. Please notify the Thereasa Parkin if any discrepancies are noted or if the meaning of any statement is not clear.

## 2023-02-19 ENCOUNTER — Other Ambulatory Visit: Payer: Self-pay

## 2023-02-19 ENCOUNTER — Encounter (INDEPENDENT_AMBULATORY_CARE_PROVIDER_SITE_OTHER): Payer: Self-pay | Admitting: Family Medicine

## 2023-02-19 ENCOUNTER — Ambulatory Visit (INDEPENDENT_AMBULATORY_CARE_PROVIDER_SITE_OTHER): Admitting: Family Medicine

## 2023-02-19 VITALS — BP 114/76 | HR 68 | Temp 97.9°F | Ht 64.0 in | Wt 167.0 lb

## 2023-02-19 DIAGNOSIS — K120 Recurrent oral aphthae: Secondary | ICD-10-CM

## 2023-02-19 DIAGNOSIS — T280XXA Burn of mouth and pharynx, initial encounter: Secondary | ICD-10-CM

## 2023-02-19 MED ORDER — LIDOCAINE HCL 2 % MUCOSAL SOLUTION
5.0000 mL | 0 refills | Status: DC | PRN
Start: 2023-02-19 — End: 2023-08-30

## 2023-02-19 MED ORDER — CEFDINIR 300 MG CAPSULE
300.0000 mg | ORAL_CAPSULE | Freq: Two times a day (BID) | ORAL | 0 refills | Status: AC
Start: 2023-02-19 — End: 2023-02-26

## 2023-02-19 NOTE — Progress Notes (Signed)
[facility]  [address]  [place] [address]  Dept: [phone]        Sondi Desch  1990-08-01  02/19/23  CHIEF COMPLAINT:Mouth Pain (Burn )  SUBJECTIVE:              Catherine Colon is an 33 y.o. female presents to the urgent care today complaining of pain in the roof of the mouth for the past 3 days after she burnt the area while eating hot chili right popped off the cracked I and burn the area.    Reports that she has done it in the past but usually he has quickly and hit has not in the past 3 days.    Has taken over-the-counter Tylenol, Motrin, has done saltwater gargles without much relief.  Has not noticed any bleeding or purulent drainage from the area.    Allergies as of 02/19/2023    (No Known Allergies)       Social History     Socioeconomic History    Marital status: Married     Spouse name: Not on file    Number of children: Not on file    Years of education: Not on file    Highest education level: Not on file   Occupational History    Not on file   Tobacco Use    Smoking status: Never    Smokeless tobacco: Never   Vaping Use    Vaping status: Never Used   Substance and Sexual Activity    Alcohol use: Not Currently     Comment: very rare    Drug use: Never    Sexual activity: Not on file   Other Topics Concern    Not on file   Social History Narrative    Not on file     Social Determinants of Health     Financial Resource Strain: Not on file   Transportation Needs: Not on file   Social Connections: Not on file   Intimate Partner Violence: Not on file   Housing Stability: Not on file        Past Medical History:   Diagnosis Date    Anxiety     Hypercholesterolemia     Low back pain          Review of Systems   All pertinent positives and negatives noted in the HPI  Constitutional: no fever, no chills  Neuro: No dizziness, No syncope  Eyes: No blurring of vision or diplopia  ENT: No dysphagia, No rhinorrhea  Respiratory: No cough, No  shortness of breath  Cardiovascular: no chest pain, No palpitation  Muskuloskeletal: No myalgias.  Skin:  No rashes      Objective:     Blood pressure 114/76, pulse 68, temperature 36.6 C (97.9 F), temperature source Tympanic, height 1.626 m (5\' 4" ), weight 75.8 kg (167 lb), SpO2 99%, not currently breastfeeding.  Pt in NAD. CNS : A& O x3  HEENT:  Roof of the mouth adjacent to teeth 13. And 14 on the hard palate shows 1 cm erythematous area, no fresh bleeding or purulent drainage noted from the area, tender to touch, no blisters or vesicles, no lip or tongue swelling,mucous membranes moist,no Tonsillar enlargement,erythema or exudate ,no lymphadenopathy.    Assessment and Plan:     ICD-10-CM    1. Aphthous ulcer  K12.0       2. Burn, mouth, initial encounter  T28.0XXA  Advised to avoid saltwater gargles since it can irritate the area and advised to avoid spicy food and acidic juices.  Given prescription for oral viscous lidocaine gargle and empirically prescribed Omnicef.   Acetaminophen p.r.n. pain advised.  Advised to follow up with primary care in one week if symptoms do not get better.   Patient agrees with above plan and verbalized understanding. All questions answered.  Portions of this note may be dictated using voice recognition software . Variances in spelling and vocabulary are possible and unintentional. Not all errors are caught/corrected. Please notify the Thereasa Parkin if any discrepancies are noted or if the meaning of any statement is not clear.   Audree Camel, MD  02/19/2023, 11:34

## 2023-02-19 NOTE — Nursing Note (Signed)
BP 114/76   Pulse 68   Temp 36.6 C (97.9 F) (Tympanic)   Ht 1.626 m (5\' 4" )   Wt 75.8 kg (167 lb)   SpO2 99%   BMI 28.67 kg/m   Laurann [place], [place]

## 2023-02-19 NOTE — Patient Instructions (Signed)
[facility]  [address], [address]  [place], [place] [address]  Phone: [phone]  Fax: [phone]             Open Daily 8:00am - 8:00pm, except Sundays 12pm-8pm         ~ Closed Thanksgiving and Christmas Day     Attending Caregiver: Audree Camel, MD      Today's orders:   Orders Placed This Encounter    lidocaine HCL (XYLOCAINE) 2 % Mucous Membrane Solution    cefdinir (OMNICEF) 300 mg Oral Capsule        Prescription(s) E-Rx to:  [id number] - [place], [place] - [address]    ________________________________________________________________________  Short Term Disability and Family Medical Leave Act  [facility] does NOT provide assistance with any disability applications.  If you feel your medical condition requires you to be on disability, you will need to follow up with  Your primary care physician or a specialist.  We apologize for any inconvenience.    For Medication Prescribed by [facility]:  As an Urgent Care facility, our clinic does NOT offer prescription refills over the telephone.    If you need more of the medication one of our medical providers prescribed, you will  Either need to be re-evaluated by [place] or see your primary care physician.    ________________________________________________________________________      It is very important that we have a phone number that is the single best way to contact you in the event that we become aware of important clinical information or concerns after your discharge.  If the phone number you provided at registration is NOT this number you should inform staff and registration prior to leaving.      Your treatment and evaluation today was focused on identifying and treating potentially emergent conditions based on your presenting signs, symptoms, and history.  The resulting initial clinical impression and treatment plan is not intended to be definitive or a substitute for a full physical examination and evaluation  by your primary care provider.  If your symptoms persist, worsen, or you develop any new or concerning symptoms, you need to be evaluated.      If you received x-rays during your visit, be aware that the final and formal interpretation of those films by a radiologist may occur after your discharge.  If there is a significant discrepancy identified after your discharge, we will contact you at the telephone number provided at registration.      If you received a pelvic exam, you may have cultures pending for sexually transmitted diseases.  Positive cultures are reported to the [facility] Department of Health as required by state law.  You should be contacted if you cultures are positive.  We will not contact you if they are negative.  You did NOT receive a PAP smear (the screening test for cervical).  This specific test for women is best performed by your gynecologist or primary care provider when indicated.      If you are over 79 year old, we cannot discuss your personal health information with a parent, spouse, family member, or anyone else without your express consent.  This does not include those who have legitimate access to your records and information to assist in your care under the provisions of HIPAA [facility] Portability and Accountability Act) law, or those to whom you have previously given express written consent to do so, such a  legal guardian or Power of [place].      You may have received medication that may cause you to feel drowsy and/or light headed for several hours.  You may even experience some amnesia of your stay.  You should avoid operating a motor vehicle or performing any activity requiring complete alertness or coordination until you feel fully awake (approximately 24-48 hours).  Avoid alcoholic beverages.  You may also have a dry mouth for several hours.  This is a normal side effect and will disappear as the effects of the medication wear off.      Instructions discussed with patient  upon discharge by clinical staff with all questions answered.  Please call [facility] [phone]) if any further questions.  Go immediately to the emergency department if any concern or worsening symptoms.      Audree Camel, MD 02/19/2023, 11:37

## 2023-02-25 ENCOUNTER — Encounter (INDEPENDENT_AMBULATORY_CARE_PROVIDER_SITE_OTHER): Payer: Self-pay | Admitting: PHYSICAL MEDICINE AND REHABILITATION

## 2023-02-28 ENCOUNTER — Telehealth (INDEPENDENT_AMBULATORY_CARE_PROVIDER_SITE_OTHER): Payer: Self-pay | Admitting: PHYSICAL MEDICINE AND REHABILITATION

## 2023-02-28 ENCOUNTER — Encounter (INDEPENDENT_AMBULATORY_CARE_PROVIDER_SITE_OTHER): Payer: Self-pay

## 2023-02-28 NOTE — Telephone Encounter (Signed)
Patient contacted [facility] to cancel both of her upcoming procedures with Dr. Cyndie Chime on 2/11 and 3/11 due to financial reasons. I have cancelled her upcoming in office appointment as well. Noted on patients chart and appt desk for documentation.      [name]

## 2023-03-01 ENCOUNTER — Ambulatory Visit (HOSPITAL_BASED_OUTPATIENT_CLINIC_OR_DEPARTMENT_OTHER)
Admission: RE | Admit: 2023-03-01 | Payer: Self-pay | Source: Ambulatory Visit | Admitting: PHYSICAL MEDICINE AND REHABILITATION

## 2023-03-01 ENCOUNTER — Encounter (HOSPITAL_BASED_OUTPATIENT_CLINIC_OR_DEPARTMENT_OTHER): Admission: RE | Payer: Self-pay | Source: Ambulatory Visit

## 2023-03-01 SURGERY — BLOCK INJECTION FACET MEDIAL BRANCH
Anesthesia: Local (Nurse-Monitored) | Laterality: Left

## 2023-03-23 ENCOUNTER — Encounter (INDEPENDENT_AMBULATORY_CARE_PROVIDER_SITE_OTHER): Payer: Self-pay | Admitting: Student in an Organized Health Care Education/Training Program

## 2023-03-23 ENCOUNTER — Other Ambulatory Visit: Payer: Self-pay

## 2023-03-23 ENCOUNTER — Ambulatory Visit: Payer: Self-pay | Attending: GENERAL | Admitting: Student in an Organized Health Care Education/Training Program

## 2023-03-23 VITALS — BP 115/78 | HR 70 | Temp 97.7°F | Resp 16 | Ht 64.0 in | Wt 168.8 lb

## 2023-03-23 DIAGNOSIS — J398 Other specified diseases of upper respiratory tract: Secondary | ICD-10-CM | POA: Insufficient documentation

## 2023-03-23 MED ORDER — PSEUDOEPHEDRINE ER 120 MG TABLET,EXTENDED RELEASE
120.0000 mg | ORAL_TABLET | Freq: Two times a day (BID) | ORAL | 0 refills | Status: DC | PRN
Start: 2023-03-23 — End: 2023-08-30
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
## 2023-03-23 NOTE — Assessment & Plan Note (Addendum)
 Since last Sunday, 2/23 she has experienced upper respiratory congestion. She notes symptoms started with body aches and low grade fever for the next days following. She notes GI issues 1 week ago which have since gotten.  She notes she "can't shake" the congestion despite trying Mucinex which hasn't helped much. 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 On exam she reported itching of the ears but there was no sign of ear infection.     - Discussed possible influenza a vs RSV-- out of treatment range for flu A  - Discussed conservative treatment options, can try Flonase   - avoid dairy, continyue hydration and rest  - Rx sudafed 120 mg for congestion  - Continue to monitor      Orders:    pseudoephedrine (SUDAFED 12 HOUR) 120 mg Oral Tablet Sustained Release; Take 1 Tablet (120 mg total) by mouth Every 12 hours as needed for Congestion

## 2023-03-23 NOTE — Progress Notes (Signed)
 FAMILY MEDICINE - ESTABLISHED PATIENT    Patient ID: Catherine Colon  MRN: [id number]  Date: 03/23/2023     ASSESSMENT AND PLAN   Catherine Colon is a 33 y.o. patient who presented to the office today for chief complaint of Sinus Pressure (11)      Assessment & Plan  Congestion of upper respiratory tract  Since last Sunday, 2/23 she has experienced upper respiratory congestion. She notes symptoms started with body aches and low grade fever for the next days following. She notes GI issues 1 week ago which have since gotten.  She notes she "can't shake" the congestion despite trying Mucinex which hasn't helped much. 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 On exam she reported itching of the ears but there was no sign of ear infection.     - Discussed possible influenza a vs RSV-- out of treatment range for flu A  - Discussed conservative treatment options, can try Flonase   - avoid dairy, continyue hydration and rest  - Rx sudafed 120 mg for congestion  - Continue to monitor      Orders:    pseudoephedrine (SUDAFED 12 HOUR) 120 mg Oral Tablet Sustained Release; Take 1 Tablet (120 mg total) by mouth Every 12 hours as needed for Congestion           SUBJECTIVE   Catherine Colon is a 33 y.o. female seen on 03/23/2023 for Sinus Pressure (11)    Patient seen unaccompanied.     For the last 1.5-2 weeks she reports congestion with sinus pressure which hasn't gone away with OTC decongestants.     Review of systems as in subjective    MEDICATIONS  Current Outpatient Medications   Medication Instructions    lidocaine HCL (XYLOCAINE) 2 % Mucous Membrane Solution 5 mL, Mucous Membrane, EVERY 4 HOURS PRN    pseudoephedrine (SUDAFED 12 HOUR) 120 mg, Oral, EVERY 12 HOURS PRN    rosuvastatin (CRESTOR) 20 mg, Oral, EVERY EVENING       OBJECTIVE   Vitals: BP 115/78   Pulse 70   Temp 36.5 C (97.7 F) (Thermal Scan)   Resp 16   Ht 1.626 m (5\' 4" )   Wt 76.6 kg (168 lb 12.8 oz)   BMI 28.97 kg/m       Bml:  Body mass index is 28.97 kg/m.     Physical Exam  Vitals reviewed.   Constitutional:       General: She is not in acute distress.     Appearance: Normal appearance.   HENT:      Head: Normocephalic and atraumatic.      Right Ear: External ear normal.      Left Ear: External ear normal.      Nose: Nose normal.      Mouth/Throat:      Mouth: Mucous membranes are moist.      Pharynx: Oropharynx is clear.   Eyes:      Extraocular Movements: Extraocular movements intact.      Conjunctiva/sclera: Conjunctivae normal.   Cardiovascular:      Rate and Rhythm: Normal rate and regular rhythm.      Pulses: Normal pulses.      Heart sounds: Normal heart sounds.   Pulmonary:      Effort: Pulmonary effort is normal. No respiratory distress.      Breath sounds: Normal breath sounds.      Comments: Intermittent cough  Musculoskeletal:  General: Normal range of motion.      Cervical back: Normal range of motion.   Lymphadenopathy:      Cervical: Cervical adenopathy present.   Skin:     General: Skin is warm and dry.   Neurological:      General: No focal deficit present.      Mental Status: She is alert and oriented to person, place, and time.         I am scribing for, and in the presence of, Dr. Juel Burrow for services provided on 03/23/2023.  Daryl Eastern, SCRIBE   Daryl Eastern, SCRIBE    I personally performed the services described in this documentation, as scribed in my presence, and it is both accurate and complete. Will Bonnet, MD      ASSESSMENT AND PLAN AT TOP OF NOTE
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
## 2023-03-29 ENCOUNTER — Encounter (HOSPITAL_BASED_OUTPATIENT_CLINIC_OR_DEPARTMENT_OTHER): Admission: RE | Payer: Self-pay | Source: Ambulatory Visit

## 2023-03-29 ENCOUNTER — Ambulatory Visit: Admission: RE | Admit: 2023-03-29 | Source: Ambulatory Visit | Admitting: PHYSICAL MEDICINE AND REHABILITATION

## 2023-03-29 SURGERY — BLOCK INJECTION FACET MEDIAL BRANCH
Anesthesia: Local (Nurse-Monitored) | Laterality: Left

## 2023-04-06 ENCOUNTER — Ambulatory Visit (INDEPENDENT_AMBULATORY_CARE_PROVIDER_SITE_OTHER): Payer: Self-pay | Admitting: PHYSICAL MEDICINE AND REHABILITATION

## 2023-04-08 ENCOUNTER — Encounter (INDEPENDENT_AMBULATORY_CARE_PROVIDER_SITE_OTHER): Payer: Self-pay | Admitting: GENERAL

## 2023-05-09 ENCOUNTER — Telehealth (HOSPITAL_COMMUNITY): Payer: Self-pay | Admitting: GENERAL

## 2023-05-09 NOTE — Telephone Encounter (Signed)
 Pt called to inform she will a physical form to be completed for her new job today 04/21.    Kaitlyn [employer]  05/09/2023, 11:44

## 2023-08-16 ENCOUNTER — Encounter (INDEPENDENT_AMBULATORY_CARE_PROVIDER_SITE_OTHER): Payer: Self-pay | Admitting: GENERAL

## 2023-08-30 ENCOUNTER — Encounter (INDEPENDENT_AMBULATORY_CARE_PROVIDER_SITE_OTHER)

## 2023-08-30 ENCOUNTER — Encounter (INDEPENDENT_AMBULATORY_CARE_PROVIDER_SITE_OTHER): Payer: Self-pay | Admitting: Orthopaedic Surgery

## 2023-08-30 ENCOUNTER — Ambulatory Visit (INDEPENDENT_AMBULATORY_CARE_PROVIDER_SITE_OTHER): Payer: Self-pay | Admitting: Orthopaedic Surgery

## 2023-08-30 ENCOUNTER — Other Ambulatory Visit: Payer: Self-pay

## 2023-08-30 VITALS — BP 116/77 | HR 62 | Resp 18 | Ht 64.0 in | Wt 168.8 lb

## 2023-08-30 DIAGNOSIS — S43431A Superior glenoid labrum lesion of right shoulder, initial encounter: Secondary | ICD-10-CM

## 2023-08-30 DIAGNOSIS — M75101 Unspecified rotator cuff tear or rupture of right shoulder, not specified as traumatic: Secondary | ICD-10-CM

## 2023-08-30 DIAGNOSIS — M25511 Pain in right shoulder: Secondary | ICD-10-CM

## 2023-08-30 DIAGNOSIS — M67813 Other specified disorders of tendon, right shoulder: Secondary | ICD-10-CM

## 2023-08-30 NOTE — H&P (Signed)
 Catherine, Colon CROSSING  [address] [place] [address]       Name: Catherine Colon MRN:  [id number]   Date: 08/30/2023 Age: 33 y.o.     Chief Complaint              Shoulder Pain Right              History of Present Illness:   Catherine Colon is a 33 y.o. White female that is right-hand-dominant.  She is a Futures trader.  She comes to clinic today for evaluation of her right shoulder.  The patient has a two-month history of right shoulder pain that it has progressively gotten worse.  The patient states that she injured her right shoulder doing CrossFit.  The patient states that she continue to work through the symptoms and as she is doing a pull up she felt a pop in his shoulder.  Since then she has had significant pain and decreased activities of daily living.  The patient denies any other injuries.  She denies any fractures.  She denies a history of instability.  She has not had surgery on her right shoulder.  The patient describes mild-to-moderate posterior right shoulder pain.  She has pain with lifting and with reaching.  The patient has weakness.  She has night pain.  Her activities of daily living are decreased.  She does not note any significant neurologic symptoms or neck pain.  She has not had an x-ray or MRI scan done of her right shoulder.  She has had no injections.  She is not presently physical therapy.      Wyvonna was present during the entire visit as a chaperone.    History:   The EMR was reviewed and the patient was asked specifically about:  No heart disease     No lung disease     No hypertension    No Diabetes mellitus     No peptic ulcer disease     No liver disease     No kidney disease     No cancer     No blood clots or bleeding problems    Past Medical History:  Past Medical History:   Diagnosis Date    Anxiety     Hypercholesterolemia     Low back pain          Past Surgical History:   Past Surgical History:   Procedure Laterality Date    HX CESAREAN SECTION  2020    HX  WISDOM TEETH EXTRACTION  2009         Family History:  Family Medical History:       Problem Relation (Age of Onset)    Heart Attack Paternal Grandfather    High Cholesterol Father, Sister    Stroke Maternal Grandmother            Social History:  Social History     Socioeconomic History    Marital status: Married   Tobacco Use    Smoking status: Never    Smokeless tobacco: Never   Vaping Use    Vaping status: Never Used   Substance and Sexual Activity    Alcohol use: Not Currently     Comment: very rare    Drug use: Never     Current Medications:  No current outpatient medications on file.     Allergies:  Allergies[1]    Review of Systems:   Constitutional: Negative  Cardiovascular:  Negative  Respiratory:  Negative  Metabolic/Diabetes:  Negative  GI/Liver:  Negative  GU/Kidney:  Negative   Psychiatry:  Negative  Skin:  Negative  Musculoskeletal:  Right shoulder pain  Neurological: Negative  All other ROS Negative    Physical Exam:   Vitals: BP 116/77 (Site: Left Arm, Patient Position: Sitting, Cuff Size: Adult)   Pulse 62   Resp 18   Ht 1.626 m (5' 4)   Wt 76.6 kg (168 lb 12.8 oz)   BMI 28.97 kg/m       Constitutional: Pleasant, cooperative female in no apparent distress  HEENT: Atraumatic, normocephalic, pupils equal  Lungs: Normal respiratory motion  Cardiovascular: Heart regular rate and rhythm, No peripheral edema noted  Musculoskeletal: see orthopedic exam  Skin: No rash or ulcers noted.  Neurologic: Cranial nerves II-XII grossly intact, gait normal  Psychiatric: Alert and oriented x3, affect and behavior within normal limits        Orthopedic Exam:   The patient was examined today.  She was alert and oriented x3.  Her gait was normal.  Her distal neurovascular exam was within normal limits.  She had a palpable radial pulse.  She had active forward elevation to 160 and passively to 180.  She had active abduction to 180.  She had external rotation with her arm abducted to 110 and internal rotation  in this position to 80.  She had active external rotation on examination.  She had 5/5 power forward elevation, abduction and internal rotation.  She had 4/5 power of external rotation.  The patient's Neer sign and Hawkins signs were negative.  Her Speed test was negative.  Her O'Brien's test was positive.  Her full can test an empty can test were positive.  Her bear hug test was negative.  Her Napoleon test was positive.  Her Yergason's test was positive.  Her biceps load test was negative.  Her shears test was positive.  Her Jobe's relocation test was positive.  She had no pain when her posterior labrum was loaded.  She had no increased mobility on examination.  She had palpable tenderness over the anterior aspect of his shoulder.  She had no obvious deformities.  Her skin was intact.    Impression:       ICD-10-CM    1. Right shoulder pain  M25.511 XR SHOULDER RIGHT     MRI SHOULDER RIGHT WO CONTRAST     Physicial Theray (Evaluate and Treat)      2. Labral tear of shoulder, right, initial encounter  S43.431A XR SHOULDER RIGHT     MRI SHOULDER RIGHT WO CONTRAST     Physicial Theray (Evaluate and Treat)      3. Rotator cuff tear, right  M75.101 XR SHOULDER RIGHT     MRI SHOULDER RIGHT WO CONTRAST     Physicial Theray (Evaluate and Treat)        Right shoulder pain     Possible labral tear right shoulder     Rotator cuff tendinitis with possible rotator cuff tear right shoulder       Plan:     Orders Placed This Encounter    XR SHOULDER RIGHT    MRI SHOULDER RIGHT WO CONTRAST    Physicial Theray (Evaluate and Treat)     The plan for this patient is for her to modify her activities.  She should avoid activities that aggravate her symptoms.  She can increase her activities as tolerated.  The patient should be careful with repetitive and strenuous  pulling, pushing and lifting.  The patient be sent for an x-ray of her right shoulder today.  The patient be sent for an MRI scan of her right shoulder to better delineate  the extent of intra and extra-articular pathology.  The patient would benefit from formal physical therapy while she awaits imaging.  The patient will follow up once her imaging is completed.                Alm SQUIBB. Valere, MD, [facility], 08/30/2023, 10:57 AM                   [1] No Known Allergies

## 2023-09-05 ENCOUNTER — Encounter (INDEPENDENT_AMBULATORY_CARE_PROVIDER_SITE_OTHER): Payer: Self-pay | Admitting: GENERAL

## 2023-09-05 DIAGNOSIS — F411 Generalized anxiety disorder: Secondary | ICD-10-CM

## 2023-09-05 DIAGNOSIS — F39 Unspecified mood [affective] disorder: Secondary | ICD-10-CM

## 2023-09-07 ENCOUNTER — Encounter (INDEPENDENT_AMBULATORY_CARE_PROVIDER_SITE_OTHER): Payer: Self-pay | Admitting: GENERAL

## 2023-09-07 MED ORDER — FLUOXETINE 40 MG CAPSULE
40.0000 mg | ORAL_CAPSULE | Freq: Every day | ORAL | 3 refills | Status: AC
Start: 2023-09-21 — End: ?

## 2023-09-07 MED ORDER — FLUOXETINE 20 MG CAPSULE
20.0000 mg | ORAL_CAPSULE | Freq: Every day | ORAL | 0 refills | Status: DC
Start: 2023-09-07 — End: 2023-09-27

## 2023-09-07 NOTE — Telephone Encounter (Signed)
 Restart 20 mg x 2 weeks, then can increase to 40 mg daily.    Lonell Boron, DO 09/07/2023

## 2023-09-08 ENCOUNTER — Other Ambulatory Visit: Payer: Self-pay

## 2023-09-08 ENCOUNTER — Ambulatory Visit
Admission: RE | Admit: 2023-09-08 | Discharge: 2023-09-08 | Disposition: A | Discharge status: Home / Self Care | Source: Ambulatory Visit | Attending: Orthopaedic Surgery | Admitting: Orthopaedic Surgery

## 2023-09-08 DIAGNOSIS — M25511 Pain in right shoulder: Secondary | ICD-10-CM | POA: Insufficient documentation

## 2023-09-08 DIAGNOSIS — M75101 Unspecified rotator cuff tear or rupture of right shoulder, not specified as traumatic: Secondary | ICD-10-CM | POA: Insufficient documentation

## 2023-09-08 DIAGNOSIS — S43431A Superior glenoid labrum lesion of right shoulder, initial encounter: Secondary | ICD-10-CM | POA: Insufficient documentation

## 2023-09-12 ENCOUNTER — Encounter (INDEPENDENT_AMBULATORY_CARE_PROVIDER_SITE_OTHER): Payer: Self-pay | Admitting: Orthopaedic Surgery

## 2023-09-12 ENCOUNTER — Telehealth (HOSPITAL_COMMUNITY): Payer: Self-pay | Admitting: GENERAL

## 2023-09-12 ENCOUNTER — Other Ambulatory Visit: Payer: Self-pay

## 2023-09-12 ENCOUNTER — Ambulatory Visit (INDEPENDENT_AMBULATORY_CARE_PROVIDER_SITE_OTHER): Admitting: Orthopaedic Surgery

## 2023-09-12 VITALS — Resp 18 | Ht 64.0 in | Wt 168.8 lb

## 2023-09-12 DIAGNOSIS — M25511 Pain in right shoulder: Secondary | ICD-10-CM

## 2023-09-12 DIAGNOSIS — S43431A Superior glenoid labrum lesion of right shoulder, initial encounter: Secondary | ICD-10-CM

## 2023-09-12 DIAGNOSIS — S43431D Superior glenoid labrum lesion of right shoulder, subsequent encounter: Secondary | ICD-10-CM

## 2023-09-12 NOTE — Telephone Encounter (Signed)
 Pt calling needs a pre op clearance, she has RPV on 10/11/23, and a pre op on 11/08/23. Pt needs to be seen sooner, per my supervisor I was to send encounter to [facility] to see if anyone there can get pt in for pre op clearance, please call pt at [phone]  Veneda Sale, MA  Centralized Scheduler, Access Center  09/12/2023, 16:01

## 2023-09-12 NOTE — H&P (Signed)
 [name], WINDMILL CROSSING  [address] [place] [address]       Name: Catherine Colon MRN:  [id number]   Date: 09/12/2023 Age: 33 y.o.     Chief Complaint              Shoulder Pain Right              History of Present Illness:   Saleema Weppler is a 33 y.o. White female that returns to clinic today for re-evaluation of her right shoulder.  She continues to have pain and disability.  Her activities of daily living are affected because of this.  The patient was sent for an MRI scan of her right shoulder.  This was reviewed and discussed in detail with the patient.  The patient has a superior labral tear with an associated paralabral cyst.      Wyvonna was present during the entire visit as a chaperone.    History:   The EMR was reviewed and the patient was asked specifically about:  No change since previous visit    Past Medical History:  Past Medical History:   Diagnosis Date    Anxiety     Hypercholesterolemia     Low back pain          Past Surgical History:   Past Surgical History:   Procedure Laterality Date    HX CESAREAN SECTION  2020    HX WISDOM TEETH EXTRACTION  2009         Family History:  Family Medical History:       Problem Relation (Age of Onset)    Heart Attack Paternal Grandfather    High Cholesterol Father, Sister    Stroke Maternal Grandmother            Social History:  Social History     Socioeconomic History    Marital status: Married   Tobacco Use    Smoking status: Never    Smokeless tobacco: Never   Vaping Use    Vaping status: Never Used   Substance and Sexual Activity    Alcohol  use: Not Currently     Comment: very rare    Drug use: Never     Current Medications:  Current Outpatient Medications   Medication Sig    FLUoxetine  (PROZAC ) 20 mg Oral Capsule Take 1 Capsule (20 mg total) by mouth Daily for 14 days    [START ON 09/21/2023] FLUoxetine  (PROZAC ) 40 mg Oral Capsule Take 1 Capsule (40 mg total) by mouth Daily     Allergies:  Allergies[1]    Review of Systems:    Constitutional: Negative  Cardiovascular:  Negative  Respiratory:  Negative  Metabolic/Diabetes:  Negative  GI/Liver:  Negative  GU/Kidney:  Negative   Psychiatry:  Negative  Skin:  Negative  Musculoskeletal:  Right shoulder pain  Neurological: Negative  All other ROS Negative    Physical Exam:   Vitals: Resp 18   Ht 1.626 m (5' 4)   Wt 76.6 kg (168 lb 12.8 oz)   BMI 28.97 kg/m       Constitutional: Pleasant, cooperative female in no apparent distress  HEENT: Atraumatic, normocephalic, pupils equal  Lungs: Normal respiratory motion  Cardiovascular: Heart regular rate and rhythm, No peripheral edema noted  Musculoskeletal: see orthopedic exam  Skin: No rash or ulcers noted.  Neurologic: Cranial nerves II-XII grossly intact, gait normal  Psychiatric: Alert and oriented x3, affect and behavior within normal limits  Orthopedic Exam:   The patient's was examined today.  She was alert and oriented x3.  Her gait was normal.  Her distal neurovascular exam was within normal limits.  She had a palpable radial pulse.  The patient had active forward elevation to 160 and active abduction to 180.  She had external rotation with her arm abducted to 110 and internal rotation in this position to 80.  She had active external rotation on examination.  She had 5/5 power forward elevation, abduction, external rotation and internal rotation.  She had some pain with resisted motion.  Her Neer sign and Hawkins signs were negative.  Her Speed test was positive.  Her O'Brien's test was negative.  Her full can and empty can test were positive.  Her bear hug and Napoleon test were negative.  Her Yergason's test was positive.  Her biceps load test was negative.  Her shears test was positive.  Her Jobe's relocation test was positive.  The patient had no pain when her posterior labrum was loaded.  She had no increased mobility on examination.  She had palpable tenderness over the anterior aspect of her shoulder.  She had no  obvious deformities.  Her skin was intact.    Impression:       ICD-10-CM    1. Right shoulder pain  M25.511       2. Labral tear of shoulder, right, subsequent encounter  S43.431D       3. Paralabral cyst of right shoulder  S43.431A         Right shoulder pain     Superior labral tear with an associated paralabral cyst right shoulder      Plan:   No orders of the defined types were placed in this encounter.    The plan for this patient is for her to modify her activities.  She should avoid activities that aggravate her symptoms.  She can increase her activities as tolerated.  The patient and I discussed her imaging/pathology in detail today.  We discussed the various treatment options.  All questions were answered.  We discussed right shoulder arthroscopy with an arthroscopic SLAP repair.  We discussed the postoperative rehabilitation.  All questions were answered.  The patient is interested in proceeding forward with surgery.  The patient be sent for preoperative clearance.  The patient will follow up once her clearance is completed.                Alm SQUIBB. Valere, MD, [facility], 09/12/2023, 3:53 PM                   [1] No Known Allergies

## 2023-09-13 ENCOUNTER — Telehealth (HOSPITAL_COMMUNITY): Payer: Self-pay | Admitting: Orthopaedic Surgery

## 2023-09-13 NOTE — Nursing Note (Signed)
 Patient has been added to the pre-op list once we receive the clearance it will be reviewed and patient will be contacted with a surgery date.  Catherine Colon, [place]

## 2023-09-13 NOTE — Telephone Encounter (Signed)
 Patient called stating she has her surgery clearance scheduled for 09/20/23. Patient would like to know what the next steps are. If you could review. Thank you.    [employer]  09/13/2023, 09:26

## 2023-09-16 ENCOUNTER — Ambulatory Visit (INDEPENDENT_AMBULATORY_CARE_PROVIDER_SITE_OTHER): Payer: Self-pay | Admitting: GENERAL

## 2023-09-19 ENCOUNTER — Other Ambulatory Visit: Payer: Self-pay

## 2023-09-20 ENCOUNTER — Ambulatory Visit (INDEPENDENT_AMBULATORY_CARE_PROVIDER_SITE_OTHER): Payer: Self-pay | Admitting: GENERAL

## 2023-09-22 ENCOUNTER — Telehealth (HOSPITAL_COMMUNITY): Payer: Self-pay | Admitting: GENERAL

## 2023-09-22 NOTE — Telephone Encounter (Signed)
 Patient called and stated her Pre Opt apt  was scd on 9/2 and then rescd again until 10/21 that has been cancelled patient hasn't gotten a call to be re scd, She states she is in a lot of pain not able to use shoulder would like an apt this week or the soonest possible so she can precede to scd her surgery patient would like a call back from office. Laymon Polka, Quarry manager  09/22/2023, 08:34

## 2023-09-23 NOTE — Telephone Encounter (Signed)
 Pt called into the access center to see if there was any way she could be seen sooner that 10/11/2023 for her pre-op appt. Pt states her surgeon will not schedule her surgery until pre-op is complete and pt states her shoulder is in pain.     This Clinical research associate informed pt that it appears staff is working on possibly getting pt scheduled at [facility], but we are still waiting for confirmation of this.    Please call pt to update and advise.    Thank you!     [place] Burcker, Quarry manager  09/23/2023, 09:36

## 2023-09-27 ENCOUNTER — Ambulatory Visit: Attending: Family Medicine | Admitting: Family Medicine

## 2023-09-27 ENCOUNTER — Encounter (INDEPENDENT_AMBULATORY_CARE_PROVIDER_SITE_OTHER): Payer: Self-pay

## 2023-09-27 ENCOUNTER — Other Ambulatory Visit: Payer: Self-pay

## 2023-09-27 ENCOUNTER — Ambulatory Visit (HOSPITAL_COMMUNITY)

## 2023-09-27 ENCOUNTER — Ambulatory Visit (HOSPITAL_BASED_OUTPATIENT_CLINIC_OR_DEPARTMENT_OTHER)
Admission: RE | Admit: 2023-09-27 | Discharge: 2023-09-27 | Disposition: A | Discharge status: Home / Self Care | Source: Ambulatory Visit | Attending: Family Medicine | Admitting: Family Medicine

## 2023-09-27 ENCOUNTER — Telehealth (HOSPITAL_COMMUNITY): Payer: Self-pay | Admitting: Orthopaedic Surgery

## 2023-09-27 ENCOUNTER — Encounter (INDEPENDENT_AMBULATORY_CARE_PROVIDER_SITE_OTHER): Payer: Self-pay | Admitting: Family Medicine

## 2023-09-27 VITALS — BP 123/79 | HR 58 | Temp 97.8°F | Resp 14 | Ht 64.0 in | Wt 168.0 lb

## 2023-09-27 DIAGNOSIS — Z01818 Encounter for other preprocedural examination: Secondary | ICD-10-CM | POA: Insufficient documentation

## 2023-09-27 DIAGNOSIS — E785 Hyperlipidemia, unspecified: Secondary | ICD-10-CM

## 2023-09-27 DIAGNOSIS — S43431A Superior glenoid labrum lesion of right shoulder, initial encounter: Secondary | ICD-10-CM | POA: Insufficient documentation

## 2023-09-27 DIAGNOSIS — F411 Generalized anxiety disorder: Secondary | ICD-10-CM | POA: Insufficient documentation

## 2023-09-27 LAB — COMPREHENSIVE METABOLIC PANEL, NON-FASTING
ALBUMIN: 4.3 g/dL (ref 3.5–5.0)
ALKALINE PHOSPHATASE: 90 U/L (ref 40–110)
ALT (SGPT): 56 U/L — ABNORMAL HIGH (ref <31)
ANION GAP: 8 mmol/L (ref 4–13)
AST (SGOT): 28 U/L (ref 11–34)
BILIRUBIN TOTAL: 0.2 mg/dL — ABNORMAL LOW (ref 0.3–1.3)
BUN/CREA RATIO: 19 (ref 6–22)
BUN: 16 mg/dL (ref 8–25)
CALCIUM: 10.2 mg/dL (ref 8.6–10.2)
CHLORIDE: 105 mmol/L (ref 96–111)
CO2 TOTAL: 26 mmol/L (ref 22–30)
CREATININE: 0.84 mg/dL (ref 0.60–1.05)
GLUCOSE: 105 mg/dL (ref 65–125)
POTASSIUM: 4.2 mmol/L (ref 3.5–5.1)
PROTEIN TOTAL: 7.6 g/dL (ref 6.0–7.9)
SODIUM: 139 mmol/L (ref 136–145)
eGFRcr - FEMALE: >90 mL/min/1.73mˆ2 (ref >=60)

## 2023-09-27 LAB — CBC W/AUTO DIFF
BASOPHIL #: <0.1 x10ˆ3/uL (ref <=0.20)
BASOPHIL %: 0.8 %
EOSINOPHIL #: 0.21 x10ˆ3/uL (ref <=0.50)
EOSINOPHIL %: 2.6 %
HCT: 43 % (ref 34.8–46.0)
HGB: 14.3 g/dL (ref 11.5–16.0)
IMMATURE GRANULOCYTE #: <0.1 x10ˆ3/uL (ref <0.10)
IMMATURE GRANULOCYTE %: 0.4 % (ref 0.0–1.0)
LYMPHOCYTE #: 2.74 x10ˆ3/uL (ref 1.00–4.80)
LYMPHOCYTE %: 34.3 %
MCH: 28.2 pg (ref 26.0–32.0)
MCHC: 33.3 g/dL (ref 31.0–35.5)
MCV: 84.8 fL (ref 78.0–100.0)
MONOCYTE #: 0.7 x10ˆ3/uL (ref 0.20–1.10)
MONOCYTE %: 8.8 %
MPV: 9.3 fL (ref 8.7–12.5)
NEUTROPHIL #: 4.26 x10ˆ3/uL (ref 1.50–7.70)
NEUTROPHIL %: 53.1 %
PLATELETS: 289 x10ˆ3/uL (ref 150–400)
RBC: 5.07 x10ˆ6/uL (ref 3.85–5.22)
RDW-CV: 11.9 % (ref 11.5–15.5)
WBC: 8 x10ˆ3/uL (ref 3.7–11.0)

## 2023-09-27 LAB — PT/INR
INR: 0.96
PROTHROMBIN TIME: 10.9 s (ref 9.2–12.3)

## 2023-09-27 LAB — PTT (PARTIAL THROMBOPLASTIN TIME): APTT: 35.6 s (ref 25.0–36.8)

## 2023-09-27 NOTE — Nursing Note (Signed)
 09/27/23 1555   Depression Screen   Little interest or pleasure in doing things. 0   Feeling down, depressed, or hopeless 0   PHQ 2 Total 0

## 2023-09-27 NOTE — Progress Notes (Addendum)
 [facility]  (A department of [facility])  [address]  [place], [place] [address]  [phone]    PRE-OP EVALUATION    09/27/2023    CC:    Chief Complaint   Patient presents with    Pre-op     Shoulder surgery       HPI:   Catherine Colon is a 33 y.o. female who presents to the clinic for pre-op evaluation.    Patient will be scheduled for right shoulder surgery after she obtains clearance.    Today, feeling well overall. Limited mobility of right shoulder due to a SLAP tear and paralabral cyst.    Compliant with Prozac  for GAD.       ROS:  No chest pain, palpitations, syncope, dyspnea, edema, or orthopnea  No easy bleeding or bruising  No GERD  No loose teeth  No fevers  No dysuria      FUNCTIONAL CAPACITY:  Can patient perform 4 METS (metabolic energy expenditure unit) without cardiac symptoms?  ?YES- Can take care of self, such as eat, dress, or use the toilet (1 MET)  ?YES- Can walk up a flight of steps or a hill or walk on level ground at 3 to 4 mph (4 METs)  ?YES- Can do heavy work around the house such as scrubbing floors or lifting or moving heavy furniture or climb two flights of stairs (between 4 and 10 METs).  ?YES- Can participate in strenuous sports such as swimming, singles tennis, football, basketball, and skiing (>10 METs)       PMH:   Patient Active Problem List    Diagnosis    Chronic left-sided low back pain without sciatica    Sacroiliac joint dysfunction of left side    Dyslipidemia    GAD (generalized anxiety disorder)       No history of heart disease including ischemic, valvular, or myopathic disease  No hypertension  No diabetes (with insulin use)  No chronic kidney disease  No cerebrovascular or peripheral artery disease  No bleeding/clotting disorder      SURGICAL HISTORY:  Past Surgical History:   Procedure Laterality Date    HX CESAREAN SECTION  2020    HX WISDOM TEETH EXTRACTION  2009       No problems with anesthesia in the  past    MEDICATIONS:  Outpatient Medications Marked as Taking for the 09/27/23 encounter (Office Visit) with Briell Paulette D, MD   Medication Sig    FLUoxetine  (PROZAC ) 40 mg Oral Capsule Take 1 Capsule (40 mg total) by mouth Daily       ALLERGIES:  Allergies[1]    Immunization History   Administered Date(s) Administered    Influenza Vaccine, 6 month-adult 10/27/2022         FAMILY HISTORY:  Family Medical History:       Problem Relation (Age of Onset)    Heart Attack Paternal Grandfather    High Cholesterol Father, Sister    Stroke Maternal Grandmother            No history of malignant hyperthermia  No history of anesthesia complications      SOCIAL HISTORY:  Social History[2]      PHYSICAL EXAM:  BP 123/79   Pulse 58   Temp 36.6 C (97.8 F) (Oral)   Resp 14   Ht 1.626 m (5' 4)   Wt 76.2 kg (168 lb)   LMP 09/08/2023 (Within Days)  SpO2 98%   BMI 28.84 kg/m     GEN:  Patient is well-developed, well-appearing, and in no acute distress.  HEENT: NC/AT, PER, oral membranes moist and pink, no pharyngeal erythema, uvula midline, Mallampati I  NECK: supple, no lymphadenopathy, no thyromegaly or nodules  RESP: Lungs clear to auscultation bilaterally, no crackles/wheezes/rhonchi, Breathing is nonlabored  CVS: Heart bradycardic but regular rhythm, no murmurs/rubs/gallops, radial pulses 2+ bilaterally  ABD: Soft, nontender, nondistended, +bowel sounds   MSK:  No edema, nontender to palpation  NEURO: alert & oriented, no gross deficits  DERM: Skin is warm & dry to touch  PSYCH: Judgment and insight are intact, affect appropriate         PERTINENT LABS:   Latest Reference Range & Units 09/27/23 16:45   WBC 3.7 - 11.0 x10^3/uL 8.0   HGB 11.5 - 16.0 g/dL 85.6   HCT 65.1 - 53.9 % 43.0   PLATELET COUNT 150 - 400 x10^3/uL 289   RBC 3.85 - 5.22 x10^6/uL 5.07   MCV 78.0 - 100.0 fL 84.8   MCHC 31.0 - 35.5 g/dL 66.6   MCH 73.9 - 67.9 pg 28.2   RDW-CV 11.5 - 15.5 % 11.9   MPV 8.7 - 12.5 fL 9.3   PMN'S % 53.1   LYMPHOCYTES %  34.3   EOSINOPHIL % 2.6   MONOCYTES % 8.8   BASOPHILS % 0.8   IMMATURE GRANULOCYTE % 0.0 - 1.0 % 0.4   IMMATURE GRANULOCYTE # <0.10 x10^3/uL <0.10      Latest Reference Range & Units 09/27/23 16:45   SODIUM 136 - 145 mmol/L 139   POTASSIUM 3.5 - 5.1 mmol/L 4.2   CHLORIDE 96 - 111 mmol/L 105   CARBON DIOXIDE 22 - 30 mmol/L 26   BUN 8 - 25 mg/dL 16   CREATININE 9.39 - 1.05 mg/dL 9.15   GLUCOSE 65 - 874 mg/dL 894   ANION GAP 4 - 13 mmol/L 8   BUN/CREAT RATIO 6 - 22  19   eGFRcr - FEMALE >=60 mL/min/1.49m^2 >90   CALCIUM 8.6 - 10.2 mg/dL 89.7   TOTAL PROTEIN 6.0 - 7.9 g/dL 7.6   ALBUMIN 3.5 - 5.0 g/dL  4.3   BILIRUBIN, TOTAL 0.3 - 1.3 mg/dL 0.2 (L)   AST (SGOT) 11 - 34 U/L 28   ALT (SGPT) <31 U/L 56 (H)   ALKALINE PHOSPHATASE 40 - 110 U/L 90   (L): Data is abnormally low  (H): Data is abnormally high     Latest Reference Range & Units 09/27/23 16:45   PROTHROMBIN TIME 9.2 - 12.3 seconds 10.9   INR  0.96   aPTT 25.0 - 36.8 seconds 35.6         IMAGING:  CXR (09/27/23)  IMPRESSION:  No evidence of acute cardiopulmonary disease.      EKG:  (09/27/23)  Sinus bradycardia  Otherwise normal ECG        ASSESSMENT/PLAN:     33 y.o. female with the following:      ICD-10-CM    1. Preop examination  Z01.818 CBC W/AUTO DIFF     COMPREHENSIVE METABOLIC PANEL, NON-FASTING     PT/INR     PTT (PARTIAL THROMBOPLASTIN TIME)     XR CHEST PA AND LATERAL     ECG 12-LEAD      2. SLAP lesion of right shoulder  S43.431A       3. GAD (generalized anxiety disorder)  F41.1  ASSESSMENT/PLAN:     1) Patient is preparing for a Intermediate-Risk Procedure based on the ACC/AHA Guideline. This corresponds to a reported risk of cardiac death or nonfatal MI that is generally 1-5%.    2) Patient has 0 of 6 risk factors used in the Revised Goldman Cardiac Risk Index (RCRI).  The associated risk of cardiac death, nonfatal MI, and nonfatal cardiac arrest is approximately 0.4%.    3) Patient's Functional Capacity is estimated to be 4-10 METS.    4)  Comorbidities / Medication recommendations  *GAD - May hold Prozac  until after surgery  *Elevated ALT - Chronic/stable for her        Patient has been informed of these risks and is willing to proceed. There is no contraindication for the surgical procedure.    Catherine JONETTA Platt, MD  09/28/2023 08:30         [1] No Known Allergies  [2]   Social History  Tobacco Use    Smoking status: Never    Smokeless tobacco: Never   Vaping Use    Vaping status: Never Used   Substance Use Topics    Alcohol use: Yes     Comment: weekly    Drug use: Never

## 2023-09-27 NOTE — Telephone Encounter (Signed)
 Pt called and stated that she's having her pre-op for right shoulder surgery today. Please call pt back to schedule surgery.  CB [phone]     Thank babara christella Milling Siford, centralized scheduler  [facility]  09/27/2023, 14:17

## 2023-09-27 NOTE — Telephone Encounter (Signed)
 Spoke to [facility], Dr. Josette does not have availability until 10/14/23. [facility] will speak to patient if other appointment options become available.     Myla Ahle, RN
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
## 2023-09-27 NOTE — Telephone Encounter (Signed)
 Spoke to patient, was able to get her scheduled today at 4 pm with DR. Sprouse.     Myla Ahle, RN

## 2023-09-27 NOTE — Nursing Note (Signed)
 Once pre-op clearance is faxed to our office we will review the information and give the patient a call with surgery date and pre-op appointment with Dr Valere.  Catherine Colon, [place]

## 2023-09-27 NOTE — Telephone Encounter (Signed)
 Patient called in following up on getting scheduled at [facility] and would like a call back soon  Leonor Bjornstad, centralized scheduler  [facility]  09/27/2023, 08:56
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
## 2023-09-28 ENCOUNTER — Ambulatory Visit (INDEPENDENT_AMBULATORY_CARE_PROVIDER_SITE_OTHER): Payer: Self-pay | Admitting: Family Medicine

## 2023-09-28 DIAGNOSIS — Z01818 Encounter for other preprocedural examination: Secondary | ICD-10-CM

## 2023-09-28 NOTE — Result Encounter Note (Signed)
 Normal preop CXR.    Catherine JONETTA Platt, MD      09/28/2023   08:13

## 2023-09-28 NOTE — Result Encounter Note (Signed)
 Normal preop labs (CBC, PT/PTT, and CMP) other than ALT 56 (chronic).    Catherine JONETTA Platt, MD      09/28/2023   08:12

## 2023-09-29 DIAGNOSIS — Z01818 Encounter for other preprocedural examination: Secondary | ICD-10-CM

## 2023-09-29 LAB — ECG 12-LEAD
Atrial Rate: 52 {beats}/min
Calculated P Axis: 43 degrees
Calculated R Axis: 47 degrees
Calculated T Axis: 41 degrees
PR Interval: 150 ms
QRS Duration: 80 ms
QT Interval: 420 ms
QTC Calculation: 390 ms
Ventricular rate: 52 {beats}/min

## 2023-09-29 NOTE — Result Encounter Note (Signed)
 Sinus bradycardia, but otherwise normal preop EKG.    Already included on preop note.      Truman JONETTA Platt, MD      09/29/2023   11:59

## 2023-09-30 ENCOUNTER — Encounter (INDEPENDENT_AMBULATORY_CARE_PROVIDER_SITE_OTHER): Payer: Self-pay | Admitting: Orthopaedic Surgery

## 2023-10-11 ENCOUNTER — Encounter (INDEPENDENT_AMBULATORY_CARE_PROVIDER_SITE_OTHER): Payer: Self-pay | Admitting: GENERAL

## 2023-10-17 ENCOUNTER — Other Ambulatory Visit: Payer: Self-pay

## 2023-10-18 ENCOUNTER — Encounter (INDEPENDENT_AMBULATORY_CARE_PROVIDER_SITE_OTHER): Payer: Self-pay | Admitting: Orthopaedic Surgery

## 2023-10-18 ENCOUNTER — Ambulatory Visit (INDEPENDENT_AMBULATORY_CARE_PROVIDER_SITE_OTHER): Admitting: Orthopaedic Surgery

## 2023-10-18 ENCOUNTER — Other Ambulatory Visit: Payer: Self-pay

## 2023-10-18 VITALS — Resp 20 | Ht 64.0 in | Wt 168.4 lb

## 2023-10-18 DIAGNOSIS — S43431A Superior glenoid labrum lesion of right shoulder, initial encounter: Secondary | ICD-10-CM

## 2023-10-18 DIAGNOSIS — S43431D Superior glenoid labrum lesion of right shoulder, subsequent encounter: Secondary | ICD-10-CM

## 2023-10-18 DIAGNOSIS — M85611 Other cyst of bone, right shoulder: Secondary | ICD-10-CM

## 2023-10-18 DIAGNOSIS — M25511 Pain in right shoulder: Secondary | ICD-10-CM

## 2023-10-18 NOTE — H&P (View-Only) (Signed)
 [name]  [address] [place] [address]       Name: Catherine Colon MRN:  [id number]   Date: 10/18/2023 Age: 33 y.o.     Chief Complaint              Shoulder Pain Right              History of Present Illness:   Catherine Colon is a 33 y.o. White female that returns to clinic today for re-evaluation of her right shoulder.  She continues to have pain and disability.  Her activities of daily living are affected because of this.  The patient has been cleared to proceed forward with right shoulder arthroscopy.      Catherine Colon was present during the entire visit as a chaperone.    History:   The EMR was reviewed and the patient was asked specifically about:  No change since previous visit    Past Medical History:  Past Medical History:   Diagnosis Date    Anxiety     Hypercholesterolemia     Low back pain          Past Surgical History:   Past Surgical History:   Procedure Laterality Date    HX CESAREAN SECTION  2020    HX WISDOM TEETH EXTRACTION  2009         Family History:  Family Medical History:       Problem Relation (Age of Onset)    Heart Attack Paternal Grandfather    High Cholesterol Father, Sister    Stroke Maternal Grandmother            Social History:  Social History     Socioeconomic History    Marital status: Married   Tobacco Use    Smoking status: Never    Smokeless tobacco: Never   Vaping Use    Vaping status: Never Used   Substance and Sexual Activity    Alcohol use: Yes     Comment: weekly    Drug use: Never     Current Medications:  Current Outpatient Medications   Medication Sig    FLUoxetine  (PROZAC ) 40 mg Oral Capsule Take 1 Capsule (40 mg total) by mouth Daily     Allergies:  Allergies[1]    Review of Systems:   Constitutional: Negative  Cardiovascular:  Negative  Respiratory:  Negative  Metabolic/Diabetes:  Negative  GI/Liver:  Negative  GU/Kidney:  Negative   Psychiatry:  Negative  Skin:  Negative  Musculoskeletal:  Right shoulder pain  Neurological: Negative  All  other ROS Negative    Physical Exam:   Vitals: Resp 20   Ht 1.626 m (5' 4)   Wt 76.4 kg (168 lb 6.4 oz)   LMP 09/08/2023 (Within Days)   BMI 28.91 kg/m       Constitutional: Pleasant, cooperative female in no apparent distress  HEENT: Atraumatic, normocephalic, pupils equal  Lungs: Normal respiratory motion  Cardiovascular: Heart regular rate and rhythm, No peripheral edema noted  Musculoskeletal: see orthopedic exam  Skin: No rash or ulcers noted.  Neurologic: Cranial nerves II-XII grossly intact, gait normal  Psychiatric: Alert and oriented x3, affect and behavior within normal limits        Orthopedic Exam:   The patient was examined today.  She was alert oriented x3.  Her gait was normal.  Her distal neurovascular exam was within normal limits.  She had a palpable radial pulse.  She had active  forward elevation to 160 and active abduction to 180.  She had external rotation with her arm abducted to 110 and internal rotation in this position to 80.  She had active external rotation on examination.  She had 5/5 power of forward elevation, abduction, external rotation and internal rotation.  The patient had no pain with resisted motion.  Her Neer sign was negative.  Her Hawkins sign was positive.  Her Speed and O'Brien's test were negative.  Her full can test was negative.  Her empty can test was positive.  Her bear hug test was negative.  Her Napoleon test was positive.  Her Yergason's test was negative.  Her biceps load and shears test were negative.  Her Jobe's relocation test was positive.  The patient's skin was intact.  She had no palpable tenderness today.  She had no obvious deformities.    Impression:       ICD-10-CM    1. Right shoulder pain  M25.511       2. Labral tear of shoulder, right, subsequent encounter  S43.431D       3. Paralabral cyst of right shoulder  S43.431A         Right shoulder pain     Superior labral tear with an associated paralabral cyst right shoulder       Plan:   No  orders of the defined types were placed in this encounter.    Plan for this patient is for her to modify her activities.  She should avoid activities that aggravate her symptoms.  She can increase her activities as tolerated.  The patient and I discussed her imaging/pathology in detail today.  We discussed right shoulder arthroscopy with an arthroscopic SLAP repair.  We discussed the potential risks and complications associated with surgery.  We discussed the postoperative rehabilitation.  All questions were answered.  The patient is interested in proceeding forward with surgery.  She signed the consent forms.  The patient will follow up once her surgery is completed.                Alm SQUIBB. Valere, MD, [facility], 10/18/2023, 10:47 AM                   [1] No Known Allergies

## 2023-10-18 NOTE — H&P (Signed)
 [name]  [address] [place] [address]       Name: Catherine Colon MRN:  [id number]   Date: 10/18/2023 Age: 33 y.o.     Chief Complaint              Shoulder Pain Right              History of Present Illness:   Catherine Colon is a 33 y.o. White female that returns to clinic today for re-evaluation of her right shoulder.  She continues to have pain and disability.  Her activities of daily living are affected because of this.  The patient has been cleared to proceed forward with right shoulder arthroscopy.      [place] was present during the entire visit as a chaperone.    History:   The EMR was reviewed and the patient was asked specifically about:  No change since previous visit    Past Medical History:  Past Medical History:   Diagnosis Date    Anxiety     Hypercholesterolemia     Low back pain          Past Surgical History:   Past Surgical History:   Procedure Laterality Date    HX CESAREAN SECTION  2020    HX WISDOM TEETH EXTRACTION  2009         Family History:  Family Medical History:       Problem Relation (Age of Onset)    Heart Attack Paternal Grandfather    High Cholesterol Father, Sister    Stroke Maternal Grandmother            Social History:  Social History     Socioeconomic History    Marital status: Married   Tobacco Use    Smoking status: Never    Smokeless tobacco: Never   Vaping Use    Vaping status: Never Used   Substance and Sexual Activity    Alcohol use: Yes     Comment: weekly    Drug use: Never     Current Medications:  Current Outpatient Medications   Medication Sig    FLUoxetine  (PROZAC ) 40 mg Oral Capsule Take 1 Capsule (40 mg total) by mouth Daily     Allergies:  Allergies[1]    Review of Systems:   Constitutional: Negative  Cardiovascular:  Negative  Respiratory:  Negative  Metabolic/Diabetes:  Negative  GI/Liver:  Negative  GU/Kidney:  Negative   Psychiatry:  Negative  Skin:  Negative  Musculoskeletal:  Right shoulder pain  Neurological: Negative  All  other ROS Negative    Physical Exam:   Vitals: Resp 20   Ht 1.626 m (5' 4)   Wt 76.4 kg (168 lb 6.4 oz)   LMP 09/08/2023 (Within Days)   BMI 28.91 kg/m       Constitutional: Pleasant, cooperative female in no apparent distress  HEENT: Atraumatic, normocephalic, pupils equal  Lungs: Normal respiratory motion  Cardiovascular: Heart regular rate and rhythm, No peripheral edema noted  Musculoskeletal: see orthopedic exam  Skin: No rash or ulcers noted.  Neurologic: Cranial nerves II-XII grossly intact, gait normal  Psychiatric: Alert and oriented x3, affect and behavior within normal limits        Orthopedic Exam:   The patient was examined today.  She was alert oriented x3.  Her gait was normal.  Her distal neurovascular exam was within normal limits.  She had a palpable radial pulse.  She had active  forward elevation to 160 and active abduction to 180.  She had external rotation with her arm abducted to 110 and internal rotation in this position to 80.  She had active external rotation on examination.  She had 5/5 power of forward elevation, abduction, external rotation and internal rotation.  The patient had no pain with resisted motion.  Her Neer sign was negative.  Her Hawkins sign was positive.  Her Speed and O'Brien's test were negative.  Her full can test was negative.  Her empty can test was positive.  Her bear hug test was negative.  Her Napoleon test was positive.  Her Yergason's test was negative.  Her biceps load and shears test were negative.  Her Jobe's relocation test was positive.  The patient's skin was intact.  She had no palpable tenderness today.  She had no obvious deformities.    Impression:       ICD-10-CM    1. Right shoulder pain  M25.511       2. Labral tear of shoulder, right, subsequent encounter  S43.431D       3. Paralabral cyst of right shoulder  S43.431A         Right shoulder pain     Superior labral tear with an associated paralabral cyst right shoulder       Plan:   No  orders of the defined types were placed in this encounter.    Plan for this patient is for her to modify her activities.  She should avoid activities that aggravate her symptoms.  She can increase her activities as tolerated.  The patient and I discussed her imaging/pathology in detail today.  We discussed right shoulder arthroscopy with an arthroscopic SLAP repair.  We discussed the potential risks and complications associated with surgery.  We discussed the postoperative rehabilitation.  All questions were answered.  The patient is interested in proceeding forward with surgery.  She signed the consent forms.  The patient will follow up once her surgery is completed.                Alm SQUIBB. Valere, MD, [facility], 10/18/2023, 10:47 AM                   [1] No Known Allergies

## 2023-10-20 ENCOUNTER — Other Ambulatory Visit: Payer: Self-pay

## 2023-10-20 ENCOUNTER — Ambulatory Visit
Admission: RE | Admit: 2023-10-20 | Discharge: 2023-10-20 | Disposition: A | Discharge status: Home / Self Care | Payer: Self-pay | Source: Ambulatory Visit | Attending: Orthopaedic Surgery | Admitting: Orthopaedic Surgery

## 2023-10-20 ENCOUNTER — Encounter (HOSPITAL_COMMUNITY): Payer: Self-pay

## 2023-10-25 ENCOUNTER — Ambulatory Visit (HOSPITAL_COMMUNITY): Payer: Self-pay

## 2023-11-02 NOTE — Anesthesia Preprocedure Evaluation (Signed)
 ANESTHESIA PRE-OP EVALUATION  Planned Procedure: ARTHROSCOPY SHOULDER LABRAL REPAIR (Right)  Review of Systems     anesthesia history negative     patient summary reviewed  nursing notes reviewed        Pulmonary  negative pulmonary ROS,    Cardiovascular  negative cardio ROS,   ECG reviewed and hyperlipidemia , Exercise Tolerance: > or = 4 METS        GI/Hepatic/Renal   negative GI/hepatic/renal ROS,         Endo/Other   neg endo/other ROS,       Neuro/Psych/MS    anxiety     Cancer    negative hematology/oncology ROS,                     Physical Assessment      Plan  ASA 2     Planned anesthesia type: general     general anesthesia with endotracheal tube intubation      PONV Plan:  I plan to administer pharmcologic prophalaxis antiemetics  POV PLAN:   plan for postoperative opioid use            Intravenous induction     Anesthesia issues/risks discussed are: Dental Injuries, Nerve Injuries, Eye /Visual Loss, PONV, Post-op Pain Management, Difficult Airway, Sore Throat, Post-op Agitation/Tantrum, Intraoperative Awareness/ Recall, Post-op Cognitive Dysfunction, Stroke, Aspiration and Cardiac Events/MI.  Anesthetic plan and risks discussed with patient           Patient's NPO status is appropriate for Anesthesia.

## 2023-11-03 ENCOUNTER — Encounter (HOSPITAL_COMMUNITY): Admission: RE | Payer: Self-pay | Source: Ambulatory Visit

## 2023-11-03 ENCOUNTER — Ambulatory Visit (HOSPITAL_COMMUNITY): Admitting: Orthopaedic Surgery

## 2023-11-03 ENCOUNTER — Ambulatory Visit (HOSPITAL_COMMUNITY): Payer: Self-pay | Admitting: Certified Registered"

## 2023-11-03 ENCOUNTER — Ambulatory Visit
Admission: RE | Admit: 2023-11-03 | Discharge: 2023-11-03 | Disposition: A | Discharge status: Home / Self Care | Source: Ambulatory Visit | Attending: Orthopaedic Surgery | Admitting: Orthopaedic Surgery

## 2023-11-03 ENCOUNTER — Ambulatory Visit (HOSPITAL_COMMUNITY): Admitting: Radiology

## 2023-11-03 ENCOUNTER — Encounter (HOSPITAL_COMMUNITY): Payer: Self-pay | Admitting: Orthopaedic Surgery

## 2023-11-03 ENCOUNTER — Encounter (HOSPITAL_COMMUNITY): Payer: Self-pay | Admitting: Certified Registered"

## 2023-11-03 DIAGNOSIS — M7551 Bursitis of right shoulder: Secondary | ICD-10-CM | POA: Insufficient documentation

## 2023-11-03 DIAGNOSIS — S43431A Superior glenoid labrum lesion of right shoulder, initial encounter: Secondary | ICD-10-CM | POA: Insufficient documentation

## 2023-11-03 DIAGNOSIS — S43421A Sprain of right rotator cuff capsule, initial encounter: Secondary | ICD-10-CM

## 2023-11-03 DIAGNOSIS — M75111 Incomplete rotator cuff tear or rupture of right shoulder, not specified as traumatic: Secondary | ICD-10-CM | POA: Insufficient documentation

## 2023-11-03 DIAGNOSIS — M25811 Other specified joint disorders, right shoulder: Secondary | ICD-10-CM | POA: Insufficient documentation

## 2023-11-03 HISTORY — PX: SHOULDER ARTHROSCOPY W/ LABRAL REPAIR: SHX2399

## 2023-11-03 SURGERY — ARTHROSCOPY SHOULDER LABRAL REPAIR
Anesthesia: General | Site: Shoulder | Laterality: Right | Wound class: Clean Wound: Uninfected operative wounds in which no inflammation occurred

## 2023-11-03 MED ORDER — SODIUM CHLORIDE 0.9 % INTRAVENOUS SOLUTION
INTRAVENOUS | Status: AC
Start: 2023-11-03 — End: 2023-11-03
  Filled 2023-11-03: qty 100

## 2023-11-03 MED ORDER — DEXAMETHASONE SODIUM PHOSPHATE 4 MG/ML INJECTION SOLUTION
INTRAMUSCULAR | Status: AC
Start: 2023-11-03 — End: 2023-11-03
  Filled 2023-11-03: qty 1

## 2023-11-03 MED ORDER — EPHEDRINE SULFATE 50 MG/ML INTRAVENOUS SOLUTION
Freq: Once | INTRAVENOUS | Status: DC | PRN
Start: 2023-11-03 — End: 2023-11-03
  Administered 2023-11-03: 10 mg via INTRAVENOUS

## 2023-11-03 MED ORDER — SUGAMMADEX 100 MG/ML INTRAVENOUS SOLUTION
Freq: Once | INTRAVENOUS | Status: DC | PRN
Start: 2023-11-03 — End: 2023-11-03
  Administered 2023-11-03: 200 mg via INTRAVENOUS

## 2023-11-03 MED ORDER — LIDOCAINE (PF) 20 MG/ML (2 %) INJECTION SOLUTION
INTRAMUSCULAR | Status: AC
Start: 2023-11-03 — End: 2023-11-03
  Filled 2023-11-03: qty 5

## 2023-11-03 MED ORDER — ONDANSETRON HCL (PF) 4 MG/2 ML INJECTION SOLUTION
INTRAMUSCULAR | Status: AC
Start: 2023-11-03 — End: 2023-11-03
  Filled 2023-11-03: qty 2

## 2023-11-03 MED ORDER — ACETAMINOPHEN 1,000 MG/100 ML (10 MG/ML) INTRAVENOUS SOLUTION
Freq: Once | INTRAVENOUS | Status: DC | PRN
Start: 2023-11-03 — End: 2023-11-03
  Administered 2023-11-03: 1000 mg via INTRAVENOUS

## 2023-11-03 MED ORDER — BUPIVACAINE (PF) 0.25 % (2.5 MG/ML) INJECTION SOLUTION
INTRAMUSCULAR | Status: AC
Start: 2023-11-03 — End: 2023-11-03
  Filled 2023-11-03: qty 30

## 2023-11-03 MED ORDER — PROPOFOL 10 MG/ML IV BOLUS
INJECTION | INTRAVENOUS | Status: AC
Start: 2023-11-03 — End: 2023-11-03
  Filled 2023-11-03: qty 20

## 2023-11-03 MED ORDER — MIDAZOLAM 1 MG/ML INJECTION SOLUTION
Freq: Once | INTRAMUSCULAR | Status: DC | PRN
Start: 2023-11-03 — End: 2023-11-03
  Administered 2023-11-03: 2 mg via INTRAVENOUS

## 2023-11-03 MED ORDER — SODIUM CHLORIDE 0.9 % INTRAVENOUS SOLUTION
2.0000 g | Freq: Once | INTRAVENOUS | Status: AC
Start: 2023-11-03 — End: 2023-11-03
  Administered 2023-11-03: 2 g via INTRAVENOUS

## 2023-11-03 MED ORDER — ACETAMINOPHEN 1,000 MG/100 ML (10 MG/ML) INTRAVENOUS SOLUTION
INTRAVENOUS | Status: AC
Start: 2023-11-03 — End: 2023-11-03
  Filled 2023-11-03: qty 100

## 2023-11-03 MED ORDER — MIDAZOLAM 1 MG/ML INJECTION WRAPPER
INTRAMUSCULAR | Status: AC
Start: 2023-11-03 — End: 2023-11-03
  Filled 2023-11-03: qty 2

## 2023-11-03 MED ORDER — FENTANYL (PF) 50 MCG/ML INJECTION SOLUTION
Freq: Once | INTRAMUSCULAR | Status: DC | PRN
Start: 2023-11-03 — End: 2023-11-03
  Administered 2023-11-03: 100 ug via INTRAVENOUS

## 2023-11-03 MED ORDER — HYDROMORPHONE 2 MG/ML INJECTION SYRINGE
INJECTION | INTRAMUSCULAR | Status: AC
Start: 2023-11-03 — End: 2023-11-03
  Filled 2023-11-03: qty 1

## 2023-11-03 MED ORDER — OXYCODONE 5 MG TABLET
5.0000 mg | ORAL_TABLET | ORAL | Status: AC | PRN
Start: 2023-11-03 — End: 2023-11-03
  Administered 2023-11-03: 5 mg via ORAL

## 2023-11-03 MED ORDER — EPHEDRINE SULFATE 50 MG/ML INTRAVENOUS SOLUTION
INTRAVENOUS | Status: AC
Start: 2023-11-03 — End: 2023-11-03
  Filled 2023-11-03: qty 1

## 2023-11-03 MED ORDER — DEXAMETHASONE SODIUM PHOSPHATE 4 MG/ML INJECTION SOLUTION
Freq: Once | INTRAMUSCULAR | Status: DC | PRN
Start: 2023-11-03 — End: 2023-11-03
  Administered 2023-11-03: 8 mg via INTRAVENOUS

## 2023-11-03 MED ORDER — FENTANYL (PF) 50 MCG/ML INJECTION SOLUTION
INTRAMUSCULAR | Status: AC
Start: 2023-11-03 — End: 2023-11-03
  Filled 2023-11-03: qty 2

## 2023-11-03 MED ORDER — LIDOCAINE (PF) 20 MG/ML (2 %) INJECTION SOLUTION
Freq: Once | INTRAMUSCULAR | Status: DC | PRN
Start: 2023-11-03 — End: 2023-11-03
  Administered 2023-11-03: 5 mL via INTRAVENOUS

## 2023-11-03 MED ORDER — OXYCODONE 5 MG TABLET
ORAL_TABLET | ORAL | Status: AC
Start: 2023-11-03 — End: 2023-11-03
  Filled 2023-11-03: qty 1

## 2023-11-03 MED ORDER — CEFAZOLIN 2 GRAM INTRAVENOUS SOLUTION
INTRAVENOUS | Status: AC
Start: 2023-11-03 — End: 2023-11-03
  Filled 2023-11-03: qty 14.71

## 2023-11-03 MED ORDER — HYDROMORPHONE 2 MG/ML INJECTION SYRINGE
INJECTION | Freq: Once | INTRAMUSCULAR | Status: DC | PRN
Start: 2023-11-03 — End: 2023-11-03
  Administered 2023-11-03 (×2): .5 mg via INTRAVENOUS

## 2023-11-03 MED ORDER — ETHYL ALCOHOL 62 % TOPICAL SWAB
1.0000 | Freq: Once | CUTANEOUS | Status: AC
Start: 2023-11-03 — End: 2023-11-03
  Administered 2023-11-03: 1 via NASAL

## 2023-11-03 MED ORDER — ONDANSETRON HCL (PF) 4 MG/2 ML INJECTION SOLUTION
Freq: Once | INTRAMUSCULAR | Status: DC | PRN
Start: 2023-11-03 — End: 2023-11-03
  Administered 2023-11-03: 4 mg via INTRAVENOUS

## 2023-11-03 MED ORDER — ROCURONIUM 10 MG/ML INTRAVENOUS SOLUTION
Freq: Once | INTRAVENOUS | Status: DC | PRN
Start: 2023-11-03 — End: 2023-11-03
  Administered 2023-11-03: 40 mg via INTRAVENOUS

## 2023-11-03 MED ORDER — SODIUM CHLORIDE 0.9 % (FLUSH) INJECTION SYRINGE
10.0000 mL | INJECTION | INTRAMUSCULAR | Status: DC | PRN
Start: 2023-11-03 — End: 2023-11-03

## 2023-11-03 MED ORDER — PROPOFOL 10 MG/ML IV BOLUS
INJECTION | Freq: Once | INTRAVENOUS | Status: DC | PRN
Start: 2023-11-03 — End: 2023-11-03
  Administered 2023-11-03: 150 mg via INTRAVENOUS

## 2023-11-03 MED ORDER — BUPIVACAINE (PF) 0.25 % (2.5 MG/ML) INJECTION SOLUTION
Freq: Once | INTRAMUSCULAR | Status: DC | PRN
Start: 2023-11-03 — End: 2023-11-03
  Administered 2023-11-03: 20 mL via INTRAMUSCULAR
  Administered 2023-11-03: 30 mL via INTRAMUSCULAR

## 2023-11-03 MED ORDER — FENTANYL (PF) 50 MCG/ML INJECTION SOLUTION
25.0000 ug | INTRAMUSCULAR | Status: DC | PRN
Start: 2023-11-03 — End: 2023-11-03

## 2023-11-03 MED ORDER — GLYCOPYRROLATE 0.2 MG/ML INJECTION SOLUTION
Freq: Once | INTRAMUSCULAR | Status: DC | PRN
Start: 2023-11-03 — End: 2023-11-03
  Administered 2023-11-03: .2 mg via INTRAVENOUS

## 2023-11-03 MED ORDER — HYDROCODONE 5 MG-ACETAMINOPHEN 325 MG TABLET
1.0000 | ORAL_TABLET | Freq: Four times a day (QID) | ORAL | 0 refills | Status: DC | PRN
Start: 2023-11-03 — End: 2023-12-07

## 2023-11-03 MED ORDER — GLYCOPYRROLATE 0.2 MG/ML INJECTION SOLUTION
INTRAMUSCULAR | Status: AC
Start: 2023-11-03 — End: 2023-11-03
  Filled 2023-11-03: qty 1

## 2023-11-03 MED ORDER — LACTATED RINGERS INTRAVENOUS SOLUTION
INTRAVENOUS | Status: DC
Start: 2023-11-03 — End: 2023-11-03

## 2023-11-03 MED ORDER — ONDANSETRON HCL (PF) 4 MG/2 ML INJECTION SOLUTION
4.0000 mg | Freq: Once | INTRAMUSCULAR | Status: DC | PRN
Start: 2023-11-03 — End: 2023-11-03

## 2023-11-03 SURGICAL SUPPLY — 38 items
ADH SKNCLS CYNCRLT EXOFIN HVSC STRL TISS LF  DISP 1G (MED SURG SUPPLIES) ×1 IMPLANT
ANCHOR SUT 1.8MM 2 KNOTLESS FIBERTAK GEN2 ×2 IMPLANT
APPL 70% ISPRP 2% CHG 26ML CHLRPRP HI-LT ORNG PREP STRL LF  DISP CLR (MED SURG SUPPLIES) ×2 IMPLANT
BANDAGE COFLX 5YDX6IN STRL CHSV SLF ADH FOAM COMPRESS TAN LF (WOUND CARE SUPPLY) ×1 IMPLANT
BLADE SHAVER 13CM 4MM COOLCUT 2 CUT POWER SFT TISS RESCT STRL DISP (ENDOSCOPIC SUPPLIES) ×1 IMPLANT
BRACE ORTHO 15-16IN ARMSH 15D LRG SHLDR QUICK RELEASE WST STRAP EXERCISE BALL ABDUCT PILLOW LF (ORTHOPEDICS (NOT IMPLANTS)) ×1 IMPLANT
CANNULA ARTHRO 8.25MM 7CM TWIST-IN SHLDR OBTURATOR THREAD TRANSLUC STRL DISP (ENDOSCOPIC SUPPLIES) ×1 IMPLANT
COLLECTOR GRAFTNET ATLGS TISS SPECI (SPECIMEN COLLECTION SUPPLIES) ×1 IMPLANT
CONN SUCT/IRRG Y 2 SPIKE (MED SURG SUPPLIES) ×2 IMPLANT
CONV USE 404726 - PACK SURG CNVRT FL CNTRL 2 SPLINT DRP 2 GWN TBRN SHLDR LF (CUSTOM TRAYS & PACK) ×1 IMPLANT
DEVICE POSITION STAR SLEEVE TRACTION SLING VELCRO FOAM ARM FRARM WRST STRL DISP (MED SURG SUPPLIES) ×1 IMPLANT
DRAPE 2 INCS FILM ANTIMIC 23X17IN IOBN STRL SURG (DRAPE/PACKS/SHEETS/OR TOWEL) ×1 IMPLANT
DRAPE 76X55IN 3 QT HALYARD LF  STRL DISP SURG (DRAPE/PACKS/SHEETS/OR TOWEL) ×1 IMPLANT
DRAPE U SPLT IMPRV 70X60IN STRL SURG POLY 21X4IN (DRAPE/PACKS/SHEETS/OR TOWEL) ×2 IMPLANT
GLOVE SURG 7 LF  PF BEAD CUF STRL CRM 11.8IN PROTEXIS PI PLISPRN THK9.1 MIL (GLOVES AND ACCESSORIES) ×2 IMPLANT
GLOVE SURG 7 LF PF SMTH BEAD CUF 288MM X 91MM STRL BLU BIOGEL PI INDCTR UNDERGLOVE (GLOVES AND ACCESSORIES) ×1 IMPLANT
GLOVE SURG 7.5 LF  PF BEAD CUF STRL CRM 11.8IN PROTEXIS PI PLISPRN THK9.1 MIL (GLOVES AND ACCESSORIES) ×2 IMPLANT
GLOVE SURG 8.5 LF  PF BEAD CUF STRL CRM 11.8IN PROTEXIS PI PLISPRN THK9.1 MIL (GLOVES AND ACCESSORIES) ×3 IMPLANT
GOWN SURG 2XL XLNG L4 REINF HKLP CLSR BRTHBL FILM SLEEVE STRL LF  DISP BLU SIRUS SMS PE 56IN (DRAPE/PACKS/SHEETS/OR TOWEL) ×1 IMPLANT
KIT ARTHRO FIX CURVE SPR DISP (ORTHOPEDICS (NOT IMPLANTS)) ×1 IMPLANT
NEEDLE EPIDRL TUOHY SHORT STRL LF  DISP (MED SURG SUPPLIES) ×1 IMPLANT
NEEDLE SPINAL PNK 3.5IN 18GA QUINCKE REG WL POLYPROP QUINCKE TIP STRL LF  DISP (MED SURG SUPPLIES) ×1 IMPLANT
PASSER QUICKPASS SUTLASSO 90D CURVE STR ERG HNDL THUMBWHEEL SUT NITINOL ASCP STRL DISP (SUTURE/WOUND CLOSURE) ×1 IMPLANT
PROBE ESURG APOLLORF I90 90 DEG ASPIRATE ABLATOR (MED SURG SUPPLIES) ×1 IMPLANT
PUMP TUBING ARTHRO 8FT REDEUCE CONN STRL (ENDOSCOPIC SUPPLIES) ×1 IMPLANT
SOL IRRG 0.9% NACL 3L PRSV FR FLXB CONTAINR STRL LF (MEDICATIONS/SOLUTIONS) ×9 IMPLANT
SOL IV 0.45% NACL 1000ML PLASTIC CONTAINR VIAFLEX LF (MEDICATIONS/SOLUTIONS) ×1 IMPLANT
SPONGE GAUZE 4X4IN MDCHC COTTON 12 PLY TY 7 LF  STRL DISP (WOUND CARE SUPPLY) ×1 IMPLANT
SUTURE 3-0 CT2 VICRYL 27IN UNDYED BRD COAT ABS (SUTURE/WOUND CLOSURE) ×1 IMPLANT
SUTURE 4-0 PS2 MONOCRYL MTPS 18IN UNDYED MONOF ABS (SUTURE/WOUND CLOSURE) ×1 IMPLANT
SYRINGE ACP STRL 2 CAP BLOOD COL (MED SURG SUPPLIES) ×2 IMPLANT
SYRINGE BD LL 1.5IN 18GA 10ML LF  STRL BLUNT FIL NEEDLE CONCEN TIP DEHP-FR MED POLYPROP REG WL DISP (MED SURG SUPPLIES) ×2 IMPLANT
TOWEL 26X16IN COTTON BLU SAFD_ISP SURG STRL LF (DRAPE/PACKS/SHEETS/OR TOWEL) ×2 IMPLANT
TRAY SMALL SETUP ~~LOC~~ - ~~LOC~~ MEDICAL CTR (CUSTOM TRAYS & PACK) ×1 IMPLANT
TUBING IRRG 8FT REDEUCE DUALWAVE BKFL CK VALVE CONN STRL (ENDOSCOPIC SUPPLIES) ×1 IMPLANT
TUBING SUCT CLR 12FT .25IN ARGYLE PVC NCDTV STR MALE FEMALE MLD CONN STRL LF (MED SURG SUPPLIES) ×3 IMPLANT
light handles (MED SURG SUPPLIES) ×1 IMPLANT
scorpion (ORTHOPEDICS (NOT IMPLANTS)) ×2 IMPLANT

## 2023-11-03 NOTE — OR Surgeon (Signed)
 [place]  [facility]  [place], [place]   Operative Note            Patient Name:  Catherine Colon  Patient MRN:  [id number]  Patient DOB:  10-Apr-1990  Date of Service:  11/03/2023    ATTENDING SURGEON:  Alm SQUIBB. Valere, MD, FRCSC  ASSISTANT: Metta Mungo, NP-C  ANESTHESIOLOGIST:  Marcey Roach, CRNA  TYPE OF ANESTHETIC: General  EBL: Minimal    PREOPERATIVE DIAGNOSIS:    1. SLAP lesion with paralabral cyst    POSTOPERATIVE DIAGNOSES:    1. SLAP lesion  2. Torn/frayed anterior labrum with large flap lesion   3. Torn/frayed posterior labrum   4. Bursal sided rotator cuff fraying    SURGICAL PROCEDURE:    1. Right shoulder arthroscopy  2. Arthroscopic SLAP repair with 2 x 1.8 mm knotless fiber tacks  3. Debridement of anterior labrum   4. Debridement of posterior labrum   5. Glenohumeral joint platelet rich plasma injection   6. Subacromial space platelet rich plasma injection   7. Suprascapular nerve block    OPERATIVE NOTE:   The patient was seen preoperatively.  She understood the potential risks and complications associated with the surgery.  She understood the postoperative rehabilitation.  All questions were answered.  The patient was examined.  She was assessed by anesthesia.  She received her preoperative antibiotics.  After this thorough evaluation the patient was taken back to the operating room.      Once in the operating room the patient was given a general anesthetic.  Once she was anesthetized she was appropriately positioned on the operating room table in the lateral decubitus position.  Care was taken to ensure that all bony prominences were well padded.  The patient's shoulder was examined again.  She had a full range of motion.  Her shoulder felt stable.  The patient's right upper extremity was then prepped and draped in the usual sterile fashion.      With the patient appropriately position prepped and draped  the operation was initiated.  Prior to doing so a time-out was performed.  The patient's shoulder was suspended with the Arthrex shoulder holder system.  A marking pen was used to mark out the bony anatomic landmarks.  An 11 scalpel blade was used to create a posterior portal.  The arthroscope was inserted into the glenohumeral joint and I initiated my diagnostic arthroscopy.      As I evaluated the anterior labral region there was significant tearing including a large flap lesion of the anterior labrum.  There was no paralabral cyst in this region as described in the MRI report.  The soft tissue in question was this large fragmented flap of anterior labrum.  There was obvious tearing of the superior labrum anterior to posterior.  The patient also had some fraying of the posterior labrum.  I then created a high anterior portal.  I inserted a twisting cannula.  I then inserted the full-radius shaver into the glenohumeral joint.  I then debrided the anterior labrum.  The flap was debrided the anterior labrum was stable once this was done.  I then debrided the posterior labrum with a full-radius shaver.  I then evaluated the SLAP area.  The superior labrum was displaceable.  I then used a combination of the full-radius shaver and the curette to prepare the superior margin of the glenoid inferior to the mobile superior labrum.  Once this was completed I then deployed a  1.8 mm knotless FiberTak into the prepared glenoid.  I then used a SutureLasso to pass the repair suture.  The labrum was repaired with this suture anchor.  This fixed it nicely back down to the prepared glenoid.  There was further extension of the labral tear posterior to this.  An additional 1.8 mm knotless FiberTak.  Both these suture anchors that were used had excellent fixation strength.  I then once again passed the repair suture through the labrum with the suture Lasso.  The more posterior anchor was then fixed in place.  Once this was completed  the labrum was fixed nicely back down to the prepared superior labrum.  The drive-through sign which was slightly loose initially was tighter after repairing the superior labrum.  The superior labrum was palpated with a probe.  The labrum was fixed tightly and nicely back down to prepared superior labrum.  I then removed the arthroscopic equipment from the patient's glenohumeral joint.    The arthroscope was inserted into the subacromial space.  The patient had some minimal bursitis.  The full-radius shaver was inserted into the subacromial space via the anterior portal.  I debrided the subacromial space with a full-radius shaver so I can evaluate the rotator cuff.  The patient had some superficial fraying of the bursal side of her rotator cuff.  Otherwise the cuff was completely intact.  I then removed the arthroscopic equipment from the patient's subacromial space.      The arthroscope was repositioned back into the glenohumeral joint.  I then injected the glenohumeral joint adjacent to the repair with platelet rich plasma.  I then removed the arthroscopic equipment from the patient's glenohumeral joint.      The arthroscope was then repositioned up in the subacromial space.  The subacromial space was injected with platelet rich plasma as well.  I then removed the arthroscopic equipment from the patient's subacromial space.      I then carefully performed a suprascapular nerve block via the Neviaser portal site.  10 cc of 0.25% Marcaine were used for this.  Portal sites were injected with 0.25% Marcaine.  The wounds were then closed in layers.  The deep layers was closed with 3-0 Vicryl and the skin was closed with 4-0 Monocryl.  Dermabond was applied over the portal sites.  A sterile dressing was applied over the patient's shoulder.  The patient was placed in an UltraSling.  The patient was then extubated transferred to her own bed.  The patient was then transferred to the recovery room in stable condition.       At the end of this procedure the sponge and instrument counts were correct.  Estimated blood loss for this case was minimal.      To perform complex arthroscopic cases such as this an assistant is necessary.  An Orthopedic surgery resident was not available.  Ms. Cher was essential for arthroscopic visualization anchor insertion, reconstruction and closure.      Alm SQUIBB. Valere, MD, [facility] 11/03/2023, 10:30

## 2023-11-03 NOTE — Anesthesia Postprocedure Evaluation (Signed)
 Anesthesia Post Op Evaluation    Patient: Catherine Colon  Procedure(s) with comments:  ARTHROSCOPY SHOULDER LABRAL REPAIR - debridement of anterior and posterior labrum.slap reapir.    Last Vitals:Temperature: 36.4 C (97.5 F) (11/03/23 1120)  Heart Rate: 61 (11/03/23 1144)  BP (Non-Invasive): 131/78 (11/03/23 1144)  Respiratory Rate: 17 (11/03/23 1125)  SpO2: 97 % (11/03/23 1144)    No notable events documented.    Patient is sufficiently recovered from the effects of anesthesia to participate in the evaluation and has returned to their pre-procedure level.  Patient location during evaluation: PACU       Patient participation: complete - patient participated  Level of consciousness: awake and alert and responsive to verbal stimuli    Pain score: 3  Pain management: adequate  Airway patency: patent    Anesthetic complications: no  Cardiovascular status: acceptable  Respiratory status: acceptable  Hydration status: acceptable  Patient post-procedure temperature: Pt Normothermic   PONV Status: Absent

## 2023-11-03 NOTE — Discharge Instructions (Signed)
 You had Tylenol 1,000 mg at approx. 8:30 am.  You received oxycodone 5 mg at approx. 11:15 am.

## 2023-11-03 NOTE — Anesthesia Transfer of Care (Signed)
 ANESTHESIA TRANSFER OF CARE   Catherine Colon is a 33 y.o. ,female, Weight: 74.8 kg (164 lb 12.8 oz)   had Procedure(s) with comments:  ARTHROSCOPY SHOULDER LABRAL REPAIR - debridement of anterior and posterior labrum.slap reapir.  performed  11/03/23   Primary Service: Alm Gal, MD    Past Medical History:   Diagnosis Date    Anxiety     Hypercholesterolemia     Low back pain       Allergy History as of 11/03/23        No Known Allergies                  I completed my transfer of care / handoff to the receiving personnel during which we discussed:  Access, Airway, All key/critical aspects of case discussed, Analgesia, Antibiotics, Expectation of post procedure, Fluids/Product, Gave opportunity for questions and acknowledgement of understanding, Labs and PMHx      Post Location: PACU                                                             Last OR Temp: Temperature: 36.5 C (97.7 F)      Airway:* No LDAs found *  Blood pressure (!) 140/84, pulse 61, temperature 36.5 C (97.7 F), resp. rate 16, height 1.626 m (5' 4), weight 74.8 kg (164 lb 12.8 oz), last menstrual period 10/08/2023, SpO2 97%, not currently breastfeeding.

## 2023-11-03 NOTE — Interval H&P Note (Signed)
 [facility]      H&P UPDATE FORM                                                                                  Jacquese, Cassarino, 33 y.o. female  Date of Admission:  11/03/2023  Date of Birth:  06/26/1990    11/03/2023    STOP: IF H&P IS GREATER THAN 30 DAYS FROM SURGICAL DAY COMPLETE NEW H&P IS REQUIRED.     H & P updated the day of the procedure.  1.  H&P completed within 30 days of surgical procedure and has been reviewed within 24 hours of admission but prior to surgery or a procedure requiring anesthesia services, the patient has been examined, and no change has occured in the patients condition since the H&P was completed.       Change in medications: No        Patient's last menstrual period was 10/08/2023 (within days).      Comments:     2.  Patient continues to be appropriate candidate for planned surgical procedure. YES    Alm Gal, MD

## 2023-11-07 ENCOUNTER — Other Ambulatory Visit (HOSPITAL_COMMUNITY): Payer: Self-pay | Admitting: Orthopaedic Surgery

## 2023-11-07 NOTE — Telephone Encounter (Signed)
 LV 10/18/23  NV 11/09/23    Dena Lee, Ambulatory Prescription Coordinator   [facility]  11/07/2023, 08:28

## 2023-11-08 ENCOUNTER — Encounter (INDEPENDENT_AMBULATORY_CARE_PROVIDER_SITE_OTHER): Payer: Self-pay | Admitting: GENERAL

## 2023-11-08 ENCOUNTER — Other Ambulatory Visit: Payer: Self-pay

## 2023-11-08 ENCOUNTER — Encounter (INDEPENDENT_AMBULATORY_CARE_PROVIDER_SITE_OTHER): Payer: Self-pay | Admitting: Orthopaedic Surgery

## 2023-11-08 NOTE — Nursing Note (Signed)
 Called pt and left a voicemail Dr. Valere received a request for pain medication. Per Dr. Valere he gave the patient 20 tablets and no refills will be given.  Catherine Colon, [place]

## 2023-11-08 NOTE — Telephone Encounter (Signed)
Have the patient do 1 month of physical therapy and then follow up in clinic.  Thanks

## 2023-11-09 ENCOUNTER — Ambulatory Visit (INDEPENDENT_AMBULATORY_CARE_PROVIDER_SITE_OTHER): Payer: Self-pay | Admitting: Orthopaedic Surgery

## 2023-11-09 ENCOUNTER — Encounter (INDEPENDENT_AMBULATORY_CARE_PROVIDER_SITE_OTHER): Payer: Self-pay | Admitting: Orthopaedic Surgery

## 2023-11-09 VITALS — Resp 20 | Ht 64.0 in | Wt 164.8 lb

## 2023-11-09 DIAGNOSIS — S43431D Superior glenoid labrum lesion of right shoulder, subsequent encounter: Secondary | ICD-10-CM

## 2023-11-09 DIAGNOSIS — Z4789 Encounter for other orthopedic aftercare: Secondary | ICD-10-CM

## 2023-11-09 DIAGNOSIS — M25511 Pain in right shoulder: Secondary | ICD-10-CM

## 2023-11-09 DIAGNOSIS — Z9889 Other specified postprocedural states: Secondary | ICD-10-CM

## 2023-11-09 NOTE — H&P (Signed)
 Catherine, Colon CR[facility]  [address] [place] [id number]       Name: Catherine Colon MRN:  [id number]   Date: 11/09/2023 Age: 33 y.o.         History of Present Illness:   Catherine Colon is a 33 y.o. White female that returns to clinic today for re-evaluation of her right shoulder.  The patient is now 1 week from the time of her right shoulder arthroscopy with an arthroscopic SLAP repair, debridement of anterior labrum and debridement posterior labrum.  The patient has pain is controlled.  The patient does not note any chest pain or shortness of breath.  The patient denies any wound problems.  The patient is wearing her sling.  She is following the postop protocol.  She has no significant complaints today in clinic.      Catherine Colon was present during the entire visit as a chaperone.        History:   The EMR was reviewed and the patient was asked specifically about:  No change since previous visit    Past Medical History:  Past Medical History:   Diagnosis Date    Anxiety     Hypercholesterolemia     Low back pain          Past Surgical History:   Past Surgical History:   Procedure Laterality Date    HX CESAREAN SECTION  2020    HX WISDOM TEETH EXTRACTION  2009    SHOULDER ARTHROSCOPY W/ LABRAL REPAIR Right 11/03/2023         Family History:  Family Medical History:       Problem Relation (Age of Onset)    Heart Attack Paternal Grandfather    High Cholesterol Father, Sister    Stroke Maternal Grandmother            Social History:  Social History     Socioeconomic History    Marital status: Married   Tobacco Use    Smoking status: Never    Smokeless tobacco: Never   Vaping Use    Vaping status: Never Used   Substance and Sexual Activity    Alcohol use: Yes     Comment: weekly    Drug use: Never   Other Topics Concern    Ability to Walk 2 Flight of Steps without SOB/CP Yes     Current Medications:  Current Outpatient Medications   Medication Sig    FLUoxetine  (PROZAC ) 40 mg Oral Capsule Take 1 Capsule  (40 mg total) by mouth Daily    HYDROcodone-acetaminophen (NORCO) 5-325 mg Oral Tablet Take 1-2 Tablets by mouth Every 6 hours as needed for Pain    OTC/HERBAL SUPPLEMENT Calm support, GTA forte ,     Allergies:  Allergies[1]    Review of Systems:   Constitutional: Negative  Cardiovascular:  Negative  Respiratory:  Negative  Metabolic/Diabetes:  Negative  GI/Liver:  Negative  GU/Kidney:  Negative   Psychiatry:  Negative  Skin:  Negative  Musculoskeletal:  Right shoulder pain  Neurological: Negative  All other ROS Negative    Physical Exam:   Vitals: LMP 10/08/2023 (Within Days)       Constitutional: Pleasant, cooperative female in no apparent distress  HEENT: Atraumatic, normocephalic, pupils equal  Lungs: Normal respiratory motion  Cardiovascular: Heart regular rate and rhythm, No peripheral edema noted  Musculoskeletal: see orthopedic exam  Skin: No rash or ulcers noted.  Neurologic: Cranial nerves II-XII grossly intact, gait normal  Psychiatric:  Alert and oriented x3, affect and behavior within normal limits        Orthopedic Exam:   The patient was examined today.  She was alert oriented x3.  Her gait was normal.  Her distal neurovascular exam was within normal limits.  She had a palpable radial pulse.  She had elbow flexion and extension.  She had passive external rotation past neutral.  Her wounds were dry and healing well.  She had no obvious deformities.  She had ecchymosis consistent with her surgery.        Impression:       ICD-10-CM    1. Status post arthroscopy of right shoulder  Z98.890       2. Right shoulder pain  M25.511       3. Labral tear of shoulder, right, subsequent encounter  S43.431D         Normal postoperative course one-week post right shoulder arthroscopy with an arthroscopic SLAP repair, debridement of anterior labrum and debridement of posterior labrum      Plan:   No orders of the defined types were placed in this encounter.    The plan for this patient is for her to continue to  follow the postop protocol.  The patient will continue using her sling.  The patient can come out of her sling 3 times daily to work on elbow flexion and extension and passive external rotation a little past neutral.  The patient will follow up in 2 weeks time.  At that time the patient's sling will be discontinued and she will initiate her stretching exercises.                Alm SQUIBB. Valere, MD, FRCSC, 11/09/2023, 1:19 PM                   [1] No Known Allergies

## 2023-11-15 ENCOUNTER — Other Ambulatory Visit: Payer: Self-pay

## 2023-11-15 ENCOUNTER — Ambulatory Visit (INDEPENDENT_AMBULATORY_CARE_PROVIDER_SITE_OTHER): Admitting: GENERAL

## 2023-11-15 ENCOUNTER — Encounter (INDEPENDENT_AMBULATORY_CARE_PROVIDER_SITE_OTHER): Payer: Self-pay | Admitting: GENERAL

## 2023-11-15 VITALS — BP 135/83 | HR 74 | Temp 98.0°F | Resp 16 | Wt 169.0 lb

## 2023-11-15 DIAGNOSIS — Z23 Encounter for immunization: Secondary | ICD-10-CM

## 2023-11-15 DIAGNOSIS — Z0001 Encounter for general adult medical examination with abnormal findings: Secondary | ICD-10-CM

## 2023-11-15 DIAGNOSIS — Z114 Encounter for screening for human immunodeficiency virus [HIV]: Secondary | ICD-10-CM

## 2023-11-15 DIAGNOSIS — G8929 Other chronic pain: Secondary | ICD-10-CM

## 2023-11-15 DIAGNOSIS — Z0000 Encounter for general adult medical examination without abnormal findings: Secondary | ICD-10-CM

## 2023-11-15 DIAGNOSIS — R102 Pelvic and perineal pain unspecified side: Secondary | ICD-10-CM

## 2023-11-15 DIAGNOSIS — Z13220 Encounter for screening for lipoid disorders: Secondary | ICD-10-CM

## 2023-11-15 DIAGNOSIS — Z1159 Encounter for screening for other viral diseases: Secondary | ICD-10-CM

## 2023-11-15 NOTE — Progress Notes (Signed)
 [place]  [facility]  Family Medicine, [facility]  [address]  [place] [address]  Phone: [phone]     Date of visit: 11/15/2023  Patient Name: Catherine Colon  Age: 33 y.o.  Gender: female  DOB: [birth year]  MRN: [id number]    Chief Complaint:   Chief Complaint   Patient presents with    Physical     Assessment/Plan:  Catherine Colon is a 33 y.o. female that presents to clinic for her annual wellness visit.    Assessment & Plan  Annual physical exam  Need for vaccination against hepatitis B virus  Screening for HIV (human immunodeficiency virus)  Need for hepatitis C screening test  Lipid screening  Patient doing well overall, recovering from surgery to repair labral tear on 11/03/23. Due for lipid screening and one-time HIV & Hep C testing.  -Ordered lipid panel (FASTING lab), HIV & Hep C screening to complete before next visit. Will discuss results at next visit.  -Hepatitis B vaccine administered today.  -Not interested in Flu or Covid vaccines at this time.  Orders:    HIV1/HIV2 SCREEN, COMBINED ANTIGEN AND ANTIBODY; Future    HEPATITIS C ANTIBODY SCREEN WITH REFLEX TO HCV PCR; Future    HEP B VIRUS RECOMB VACCINE(ADMIN) ADULT    Chronic pelvic pain in female  Chronic posterior pelvic/low back pain that is worse after periods of movement/exercise and at time of ovulation. She has seen a chiropractor, graduated pelvic floor therapy, and gotten injections from pain management without alleviation of pain.   -Ordered pelvic ultrasound to rule out ovarian cyst, endometrioma, other pelvic abnormality.  Orders:    US  PELVIS; Future               ICD-10-CM    1. Annual physical exam  Z00.00 HIV1/HIV2 SCREEN, COMBINED ANTIGEN AND ANTIBODY     HEPATITIS C ANTIBODY SCREEN WITH REFLEX TO HCV PCR     HEP B VIRUS RECOMB VACCINE(ADMIN) ADULT      2. Need for vaccination against hepatitis B virus  Z23 HEP B VIRUS RECOMB VACCINE(ADMIN) ADULT      3. Screening for HIV  (human immunodeficiency virus)  Z11.4 HIV1/HIV2 SCREEN, COMBINED ANTIGEN AND ANTIBODY      4. Need for hepatitis C screening test  Z11.59 HEPATITIS C ANTIBODY SCREEN WITH REFLEX TO HCV PCR      5. Chronic pelvic pain in female  R10.20 US  PELVIS    G89.29       6. Lipid screening  Z13.220         HPI:  Catherine Colon is a 33 y.o. female who presents to clinic today for her annual wellness visit.    Catherine Colon recently had surgery to repair a labral tear if her right shoulder on 11/03/23 and is wearing a sling and ice pack today. Says she is overall doing well and recovering from surgery well. She is not currently requiring any pain medication and reports ice is keeping pain under control. She is following up with ortho in about a week to determine if she can stop using the sling.    Reports her anxiety is doing much better now that she is back up to the 40 mg fluoxetine  daily.     Catherine Colon continues to have low back/posterior pelvic pain localized around the L sacroiliac joint. She has seen a chiropractor, graduated pelvic floor therapy, and gotten injections from pain management without alleviation of pain. She  describes a dull constant pain that is worse after periods of movement/exercise and at time of ovulation. She reports a history of benign ovarian cysts seen on ultrasound. Endorses history of loose stools, pressure/pain with sex. Denies urinary symptoms, pain with bowel movements, irregular menstrual cycles. She has concern about her liver & pancreatic function and started taking Digestin over the summer in an attempt to aid these.    Health Maintenance:  Blood pressure screen: 135/83 today    Cervical cancer screen: Negative HPV & pap in 2022, due on 2027 [Age 40 (Q3Y; Q5Y with HPV co-testing after age 6 or Q30Y HPV-only); stop at 65]   Breast cancer screen: N/A due to age [Current guidelines start at age 65; stop age at 75]   Osteoporosis screen: N/A due to age 27 58 for postmenopausal women or women with risk  factors]   Colon cancer screen: N/A due to age [Age 32 unless high-risk (first degree relative, UC/Crohn's; stop at 75]   Lung cancer screen: N/A [Age 30-77; 20 pack-yrs; current or stopped smoking w/i last 15 years]   Lipid screen: Ordered today    Aspirin (primary): Not using regularly [Grade C; ages 67-59 with >/=10% 10-year ASCVD risk]   Diabetes screen: Non-fasting glucose 105 on 09/27/23 [Age 54-70 with BMI >/=30; younger with add'l risk factors]   HIV screen: Ordered today [One-time universal screening age 26-18+]   Hepatitis C screen: Ordered today [One-time universal screening age 26-18+]   Tetanus vaccination: Discuss at next visit    Influenza vaccination: Refused    COVID vaccination: Refused    Pneumonia vaccination: N/A due to age 76 67 or chronic heart or lung disease, diabetes, cigarette smoking]   Shingles vaccination: N/A due to age 14 47; 2nd dose 2-6 months after]   AAA screen: N/A [Men age 10-75 who have ever smoked]   Tobacco/alcohol  use: Social History[1]    Diet/exercise counseling: Discussed healthy diet and exercise, she is currently doing both to try to lose weight with success    Contraception Husband has vasectomy             Review of Systems   Constitutional:  Negative for chills, fatigue, fever and unexpected weight change.   HENT:  Negative for congestion, hearing loss, rhinorrhea and sinus pain.    Eyes:  Negative for pain, discharge and visual disturbance.   Respiratory:  Negative for cough, chest tightness and shortness of breath.    Cardiovascular:  Positive for leg swelling. Negative for chest pain and palpitations.   Gastrointestinal:  Negative for blood in stool, constipation, diarrhea, nausea and vomiting.   Endocrine: Negative for cold intolerance and polydipsia.   Genitourinary:  Negative for difficulty urinating, dysuria, menstrual problem, pelvic pain and vaginal discharge.   Musculoskeletal:  Positive for back pain (chronic lower back pain). Negative for gait problem.    Psychiatric/Behavioral:  Negative for agitation, self-injury and suicidal ideas. The patient is not nervous/anxious.       Historical Data   Past Medical History:  Past Medical History:   Diagnosis Date    Anxiety     Hypercholesterolemia     Low back pain          Allergies[2]  Current Outpatient Medications   Medication Sig    DIETARY SUPPLEMENT ORAL Take by mouth Daily    FLUoxetine  (PROZAC ) 40 mg Oral Capsule Take 1 Capsule (40 mg total) by mouth Daily    HYDROcodone -acetaminophen  (NORCO) 5-325 mg Oral Tablet Take 1-2  Tablets by mouth Every 6 hours as needed for Pain    OTC/HERBAL SUPPLEMENT Calm support, GTA forte ,     Social History[3]  Family Medical History:       Problem Relation (Age of Onset)    Heart Attack Paternal Grandfather    High Cholesterol Father, Sister    Stroke Maternal Grandmother                Physical Exam:  BP 135/83   Pulse 74   Temp 36.7 C (98 F) (Tympanic)   Resp 16   Wt 76.7 kg (169 lb)   LMP 11/07/2023 (Exact Date)   BMI 29.01 kg/m        Physical Exam  Constitutional:       General: She is not in acute distress.     Appearance: Normal appearance. She is not ill-appearing.   HENT:      Head: Normocephalic and atraumatic.      Right Ear: Tympanic membrane, ear canal and external ear normal.      Left Ear: Tympanic membrane, ear canal and external ear normal.      Nose: Nose normal. No rhinorrhea.      Mouth/Throat:      Mouth: Mucous membranes are moist.      Pharynx: Posterior oropharyngeal erythema (slight erythema likely due to intubation 1.5 wks ago) present. No oropharyngeal exudate.   Eyes:      General: No scleral icterus.        Right eye: No discharge.         Left eye: No discharge.      Conjunctiva/sclera: Conjunctivae normal.      Pupils: Pupils are equal, round, and reactive to light.   Cardiovascular:      Rate and Rhythm: Normal rate and regular rhythm.      Heart sounds: Normal heart sounds. No murmur heard.     No friction rub. No gallop.   Pulmonary:       Effort: Pulmonary effort is normal. No respiratory distress.      Breath sounds: Normal breath sounds. No stridor. No wheezing or rales.   Musculoskeletal:      Right lower leg: No edema.      Left lower leg: No edema.      Comments: Deferred due to R arm sling, 1.5 wk post-op labrum repair   Skin:     General: Skin is warm and dry.      Coloration: Skin is not jaundiced.   Neurological:      General: No focal deficit present.      Mental Status: She is alert and oriented to person, place, and time. Mental status is at baseline.   Psychiatric:         Mood and Affect: Mood normal.         Behavior: Behavior normal.         Judgment: Judgment normal.         Catherine Colon, MED STUDENT 11/15/2023, 10:22   Medical Student  [place]  [employer]  Portions of this note may be dictated using voice recognition software or a dictation service. Variances in spelling and vocabulary are possible and unintentional. Not all errors are caught/corrected. Please notify the dino if any discrepancies are noted or if the meaning of any statement is not clear.        I have seen and examined the patient in concert with the medical student as noted  above.  The above note has been modified as necessary by me to reflect my own, personal collection/validation of the patient's HPI, patient history, physical examination, and assessment and plan.  Lonell Boron, DO       [1]   Social History  Tobacco Use    Smoking status: Never    Smokeless tobacco: Never   Vaping Use    Vaping status: Never Used   Substance Use Topics    Alcohol  use: Yes     Comment: weekly    Drug use: Never   [2] No Known Allergies  [3]   Social History  Tobacco Use    Smoking status: Never    Smokeless tobacco: Never   Vaping Use    Vaping status: Never Used   Substance Use Topics    Alcohol  use: Yes     Comment: weekly    Drug use: Never

## 2023-11-15 NOTE — Patient Instructions (Signed)
 Vaccine Information Statement    Hepatitis B Vaccine: What You Need to Know    Many vaccine information statements are available in Spanish and other languages. See PromoAge.com.br.  Hojas de informacin sobre vacunas estn disponibles en espaol y en muchos otros idiomas. Visite PromoAge.com.br.    1. Why get vaccinated?    Hepatitis B vaccine can prevent hepatitis B. Hepatitis B is a liver disease that can cause mild illness lasting a few weeks, or it can lead to a serious, lifelong illness.      Acute hepatitis B is a short-term illness that can lead to fever, fatigue, loss of appetite, nausea, vomiting, jaundice (yellow skin or eyes, dark urine, clay-colored bowel movements), and pain in the muscles, joints, and stomach.  Chronic hepatitis B is a long-term illness that occurs when the hepatitis B virus remains in a person's body. Most people who go on to develop chronic hepatitis B do not have symptoms, but it is still very serious and can lead to liver damage (cirrhosis), liver cancer, and death. Chronically infected people can spread hepatitis B virus to others, even if they do not feel or look sick themselves.    Hepatitis B is spread when blood, semen, or other body fluid infected with the hepatitis B virus enters the body of a person who is not infected. People can become infected through:  Birth (if a pregnant woman has hepatitis B, her baby can become infected)  Sharing items such as razors or toothbrushes with an infected person  Contact with the blood or open sores of an infected person  Sex with an infected partner  Sharing needles, syringes, or other drug-injection equipment  Exposure to blood from needlesticks or other sharp instruments    Most people who are vaccinated with hepatitis B vaccine are immune for life.     2. Hepatitis B vaccine    Hepatitis B vaccine is usually given as 2, 3, or 4 shots.    Infants should get their first dose of hepatitis B vaccine at birth and will usually  complete the series at 92-73 months of age. The birth dose of hepatitis B vaccine is an important part of preventing long-term illness in infants and the spread of hepatitis B in the [place].    Anyone 72 years of age or younger who has not yet gotten the vaccine should be vaccinated.    Hepatitis B vaccination is recommended for adults 60 years or older at increased risk of exposure to hepatitis B who were not vaccinated previously. Adults 60 years or older who are not at increased risk and were not vaccinated in the past may also be vaccinated.     Hepatitis B vaccine may be given as a stand-alone vaccine, or as part of a combination vaccine (a type of vaccine that combines more than one vaccine together into one shot).     Hepatitis B vaccine may be given at the same time as other vaccines.    3. Talk with your health care provider    Tell your vaccination provider if the person getting the vaccine:  Has had an allergic reaction after a previous dose of hepatitis B vaccine, or has any severe, life-threatening allergies     In some cases, your health care provider may decide to postpone hepatitis B vaccination until a future visit.    Pregnant or breastfeeding women who were not vaccinated previously should be vaccinated. Pregnancy or breastfeeding are not reasons to avoid hepatitis  B vaccination.     People with minor illnesses, such as a cold, may be vaccinated. People who are moderately or severely ill should usually wait until they recover before getting hepatitis B vaccine.    Your health care provider can give you more information.    4. Risks of a vaccine reaction    Soreness where the shot is given, fever, headache, and fatigue (feeling tired) can happen after hepatitis B vaccination.  People sometimes faint after medical procedures, including vaccination. Tell your provider if you feel dizzy or have vision changes or ringing in the ears.    As with any medicine, there is a very remote chance of a  vaccine causing a severe allergic reaction, other serious injury, or death.    5. What if there is a serious problem?    An allergic reaction could occur after the vaccinated person leaves the clinic. If you see signs of a severe allergic reaction (hives, swelling of the face and throat, difficulty breathing, a fast heartbeat, dizziness, or weakness), call 9-1-1 and get the person to the nearest hospital.    For other signs that concern you, call your health care provider.    Adverse reactions should be reported to the Vaccine Adverse Event Reporting System (VAERS). Your health care provider will usually file this report, or you can do it yourself. Visit the VAERS website at www.vaers.LAgents.no or call 7062795693. VAERS is only for reporting reactions, and VAERS staff members do not give medical advice.    6. The National Vaccine Injury Compensation Program    The [employer] Vaccine Injury Compensation Program (VICP) is a federal program that was created to compensate people who may have been injured by certain vaccines. Claims regarding alleged injury or death due to vaccination have a time limit for filing, which may be as short as two years. Visit the VICP website at SpiritualWord.at or call (705)782-8866 to learn about the program and about filing a claim.     7. How can I learn more?    Ask your health care provider.   Call your local or state health department.  Visit the website of the Food and Drug Administration (FDA) for vaccine package inserts and additional information at FinderList.no  Contact the Centers for Disease Control and Prevention (CDC):  Call (619) 108-7253 (1-800-CDC-INFO) or  Visit CDC's website at PicCapture.uy.    Vaccine Information Statement (Interim)  Hepatitis B Vaccine   02/18/2023  [address] [place]  [phone]     Department of Health and Insurance risk surveyor for Disease Control and Prevention    Office Use Only

## 2023-11-15 NOTE — Nursing Note (Signed)
 Chief Complaint:   Chief Complaint              Physical           Functional Health Screen  Review Flowsheet  More data exists         11/15/2023   FUNCTIONAL HEALTH SCREENING   Because we are aware of abuse and domestic violence today, we ask all patients: Are you being hurt, hit, or frightened by anyone at your home or in your life?  N   Do you have any basic needs within your home that are not being met? (such as Food, Shelter, [employer], Tranportation, paying for bills and/or medications) N   Do you have any advanced directives? No Advance     BP 135/83   Pulse 74   Temp 36.7 C (98 F) (Tympanic)   Resp 16   Wt 76.7 kg (169 lb)   LMP 11/07/2023 (Exact Date)   BMI 29.01 kg/m       Tobacco Use History[1]  Patient Health Rating           Depression Screening  PHQ Questionnaire     Allergies:  Allergies[2]  Medication History  Reviewed for OTC medication and any new medications, provider will review medication history  Results through Enter/Edit  No results found for this or any previous visit (from the past 24 hours).  POCT Results  Care Team  Patient Care Team:  Bechtold, Dana, DO as PCP - General (FAMILY MEDICINE)  Immunizations - last 24 hours       None          Burnadette Sharps, CMA  11/15/2023, 10:21           [1]   Social History  Tobacco Use   Smoking Status Never   Smokeless Tobacco Never   [2] No Known Allergies

## 2023-11-15 NOTE — Progress Notes (Deleted)
 FAMILY MEDICINE, [facility]  [address]  [place] [address]      ESTABLISHED PATIENT - RETURN VISIT    Patient ID: Lilja Soland  DOB: September 18, 1990  MRN: [id number]  Date: 11/15/2023    ASSESSMENT AND PLAN   Jemiah Cuadra is a 33 y.o. patient who presented to the office today for chief complaint of No chief complaint on file.      Assessment & Plan               Assessment & Plan         No follow-ups on file.        {This patient MAY be non-adherent to their STATIN medication. (If medication adherence given choose from SmartList. This will disappear if unselected upon signing note):46834}            Health Maintenance:  Health Maintenance Due   Topic Date Due    Hepatitis C screening  Never done    HIV Screening  Never done    Hepatitis B Vaccine (1 of 3 - 19+ 3-dose series) Never done    Pap smear/HPV  Never done    NonMedicare Preventative Exam  09/16/2023    Influenza Vaccine (1) 09/19/2023    Covid-19 Vaccine (Shared decision making) (1 - 2025-26 season) Never done       {HM options:36756}    Parts of this note were completed with voice recognition software and errors may have escaped proofreading. If you are reading this and do not understand something, please contact the physician for clarification.      SUBJECTIVE   Lucina Betty is a 33 y.o. patient seen on 11/15/2023 for No chief complaint on file.    Patient seen {patient seen with:36793::unaccompanied}.    History of Present Illness              {MDM data requirements:36896::On the day of the encounter, a total of  30 minutes was spent on this patient encounter including review of historical information, examination, documentation and post-visit activities. }    Review of systems as in subjective    OBJECTIVE   Vitals: LMP 10/08/2023 (Within Days)       BMl: There is no height or weight on file to calculate BMI.     Physical Exam    Current Outpatient Medications   Medication Instructions    FLUoxetine   (PROZAC ) 40 mg, Oral, Daily    HYDROcodone-acetaminophen (NORCO) 5-325 mg Oral Tablet 1-2 Tablets, Oral, EVERY 6 HOURS PRN    OTC/HERBAL SUPPLEMENT Calm support, GTA forte ,        Sadiyah Kangas, DO    ASSESSMENT AND PLAN AT TOP OF NOTE    This note was created with assistance from [employer] via capture of conversational audio. Consent was obtained from the patient and all parties present prior to recording.

## 2023-11-22 ENCOUNTER — Ambulatory Visit (HOSPITAL_COMMUNITY): Payer: Self-pay | Admitting: GENERAL

## 2023-11-22 DIAGNOSIS — G43909 Migraine, unspecified, not intractable, without status migrainosus: Secondary | ICD-10-CM

## 2023-11-22 NOTE — Telephone Encounter (Addendum)
 Pt calling to report she has a migraine today, she has tried Tylenol and ibuprofen  both to no effect. Pt is concerned because she just had surgery and is worried about blood clots. Pt reports a moderate to severe headache but no other symptoms, and is alert and oriented. She is asking if medication can be sent in or if she can do anything else to get some relief. Pt does have a history of migraines.   [facility] [place] confirmed.  Lacinda Dixons, RN  [facility]  11/22/2023, 13:40  Reason for Disposition   Similar to previously diagnosed migraine headaches (all triage questions negative)    Additional Information   Negative: Difficult to awaken or acting confused  (e.g., disoriented, slurred speech)   Negative: [1] Weakness of the face, arm or leg on one side of the body AND [2] new onset   Negative: [1] Numbness of the face, arm or leg on one side of the body AND [2] new onset   Negative: [1] Loss of speech or garbled speech AND [2] new onset   Negative: Sounds like a life-threatening emergency to the triager   Negative: Followed a head injury within last 3 days   Negative: Unable to walk, or can only walk with assistance (e.g., requires support)   Negative: Stiff neck (can't touch chin to chest)   Negative: Severe pain in one eye   Negative: [1] Other family members (or roommates) with headaches AND [2] possibility of carbon monoxide exposure    Protocols used: Pregnancy - Headache-ADULT-AH

## 2023-11-23 ENCOUNTER — Ambulatory Visit (INDEPENDENT_AMBULATORY_CARE_PROVIDER_SITE_OTHER): Payer: Self-pay | Admitting: Orthopaedic Surgery

## 2023-11-23 ENCOUNTER — Encounter (INDEPENDENT_AMBULATORY_CARE_PROVIDER_SITE_OTHER): Payer: Self-pay | Admitting: Orthopaedic Surgery

## 2023-11-23 ENCOUNTER — Other Ambulatory Visit: Payer: Self-pay

## 2023-11-23 VITALS — Resp 18 | Ht 64.0 in | Wt 169.0 lb

## 2023-11-23 DIAGNOSIS — S43431A Superior glenoid labrum lesion of right shoulder, initial encounter: Secondary | ICD-10-CM

## 2023-11-23 DIAGNOSIS — S43431D Superior glenoid labrum lesion of right shoulder, subsequent encounter: Secondary | ICD-10-CM

## 2023-11-23 DIAGNOSIS — Z4789 Encounter for other orthopedic aftercare: Secondary | ICD-10-CM

## 2023-11-23 DIAGNOSIS — M25511 Pain in right shoulder: Secondary | ICD-10-CM

## 2023-11-23 DIAGNOSIS — Z9889 Other specified postprocedural states: Secondary | ICD-10-CM

## 2023-11-23 MED ORDER — SUMATRIPTAN 25 MG TABLET
25.0000 mg | ORAL_TABLET | Freq: Once | ORAL | 0 refills | Status: AC | PRN
Start: 2023-11-23 — End: ?

## 2023-11-23 NOTE — H&P (Signed)
 Catherine Colon, WINDMILL CROSSING  [address] [place] [address]       Name: Catherine Colon MRN:  [id number]   Date: 11/23/2023 Age: 33 y.o.         History of Present Illness:   Catherine Colon is a 33 y.o. White female that returns to clinic today for re-evaluation of her right shoulder.  She is now 3 weeks from the time of her right shoulder arthroscopy with an arthroscopic SLAP repair, debridement of anterior labrum and debridement of posterior labrum.  The patient states that her pain is controlled.  She is wearing the sling.  The patient is following the postop protocol.  The patient has no significant complaints today in clinic.      Wyvonna was present during the entire visit as a chaperone.        History:   The EMR was reviewed and the patient was asked specifically about:  No change since previous visit    Past Medical History:  Past Medical History:   Diagnosis Date    Anxiety     Hypercholesterolemia     Low back pain          Past Surgical History:   Past Surgical History:   Procedure Laterality Date    HX CESAREAN SECTION  2020    HX WISDOM TEETH EXTRACTION  2009    SHOULDER ARTHROSCOPY W/ LABRAL REPAIR Right 11/03/2023         Family History:  Family Medical History:       Problem Relation (Age of Onset)    Heart Attack Paternal Grandfather    High Cholesterol Father, Sister    Stroke Maternal Grandmother            Social History:  Social History     Socioeconomic History    Marital status: Married   Tobacco Use    Smoking status: Never    Smokeless tobacco: Never   Vaping Use    Vaping status: Never Used   Substance and Sexual Activity    Alcohol use: Yes     Comment: weekly    Drug use: Never   Other Topics Concern    Ability to Walk 2 Flight of Steps without SOB/CP Yes     Current Medications:  Current Outpatient Medications   Medication Sig    DIETARY SUPPLEMENT ORAL Take by mouth Daily    FLUoxetine  (PROZAC ) 40 mg Oral Capsule Take 1 Capsule (40 mg total) by mouth Daily     HYDROcodone-acetaminophen (NORCO) 5-325 mg Oral Tablet Take 1-2 Tablets by mouth Every 6 hours as needed for Pain    OTC/HERBAL SUPPLEMENT Calm support, GTA forte ,    SUMAtriptan (IMITREX) 25 mg Oral Tablet Take 1 Tablet (25 mg total) by mouth Once, as needed for Migraine for up to 1 dose May repeat in 2 hours if needed     Allergies:  Allergies[1]    Review of Systems:   Constitutional: Negative  Cardiovascular:  Negative  Respiratory:  Negative  Metabolic/Diabetes:  Negative  GI/Liver:  Negative  GU/Kidney:  Negative   Psychiatry:  Negative  Skin:  Negative  Musculoskeletal:  Right shoulder pain  Neurological: Negative  All other ROS Negative    Physical Exam:   Vitals: LMP 11/07/2023 (Exact Date)       Constitutional: Pleasant, cooperative female in no apparent distress  HEENT: Atraumatic, normocephalic, pupils equal  Lungs: Normal respiratory motion  Cardiovascular: Heart regular rate and rhythm,  No peripheral edema noted  Musculoskeletal: see orthopedic exam  Skin: No rash or ulcers noted.  Neurologic: Cranial nerves II-XII grossly intact, gait normal  Psychiatric: Alert and oriented x3, affect and behavior within normal limits        Orthopedic Exam:   The patient's was examined today.  She was alert oriented x3.  Her gait was normal.  Her distal neurovascular exam was within normal limits.  She had a palpable radial pulse.  She had 140 of active forward elevation and 150 passive forward elevation.  She had external rotation with her arm abducted to 80 and internal rotation in this position to 60.  She had external rotation with her arm at her side to 60.  She had active external rotation on examination.  Her wounds are healing nicely.  She had no obvious deformities.    Impression:       ICD-10-CM    1. Status post arthroscopy of right shoulder  Z98.890       2. Right shoulder pain  M25.511       3. Labral tear of shoulder, right, subsequent encounter  S43.431D       4. Paralabral cyst of right shoulder   S43.431A         Normal postoperative course 3 weeks post right shoulder arthroscopy with an arthroscopic SLAP repair, debridement of anterior labrum and debridement of posterior labrum       Plan:   No orders of the defined types were placed in this encounter.    The plan for this patient is for her to continue to follow the postop protocol.  The patient will discontinue the use of her sling today.  The patient will initiate her stretching exercises.  These were demonstrated to the patient today.  These exercises will be done 3 times daily for 2-3 minutes each exercise.  These exercises consist of a table top slide, wall walking, pulley and passive external rotation to the side.  The patient understands that she still has restrictions with what she can and can not do.  She can not hold anything heavier than a coffee cup.  The patient will follow up in 2 weeks time for reassessment.  At that time the patient will initiate her strengthening exercises with the red Thera-Band.      Alm SQUIBB. Valere, MD, FRCSC, 11/23/2023, 1:23 PM                   [1] No Known Allergies

## 2023-11-29 ENCOUNTER — Ambulatory Visit
Admission: RE | Admit: 2023-11-29 | Discharge: 2023-11-29 | Disposition: A | Discharge status: Home / Self Care | Source: Ambulatory Visit | Attending: GENERAL | Admitting: GENERAL

## 2023-11-29 ENCOUNTER — Other Ambulatory Visit: Payer: Self-pay

## 2023-11-29 DIAGNOSIS — G8929 Other chronic pain: Secondary | ICD-10-CM | POA: Insufficient documentation

## 2023-11-29 DIAGNOSIS — R102 Pelvic and perineal pain unspecified side: Secondary | ICD-10-CM | POA: Insufficient documentation

## 2023-12-07 ENCOUNTER — Other Ambulatory Visit: Payer: Self-pay

## 2023-12-07 ENCOUNTER — Ambulatory Visit (INDEPENDENT_AMBULATORY_CARE_PROVIDER_SITE_OTHER): Payer: Self-pay | Admitting: Orthopaedic Surgery

## 2023-12-07 ENCOUNTER — Encounter (INDEPENDENT_AMBULATORY_CARE_PROVIDER_SITE_OTHER): Payer: Self-pay | Admitting: Orthopaedic Surgery

## 2023-12-07 VITALS — Resp 20 | Ht 64.0 in | Wt 169.0 lb

## 2023-12-07 DIAGNOSIS — Z9889 Other specified postprocedural states: Secondary | ICD-10-CM

## 2023-12-07 DIAGNOSIS — S43431D Superior glenoid labrum lesion of right shoulder, subsequent encounter: Secondary | ICD-10-CM

## 2023-12-07 DIAGNOSIS — M25511 Pain in right shoulder: Secondary | ICD-10-CM

## 2023-12-07 NOTE — H&P (Signed)
 [name], WINDMILL CROSSING  [address] [place] [address]       Name: Catherine Colon MRN:  [id number]   Date: 12/07/2023 Age: 33 y.o.     Chief Complaint              Shoulder Pain Right            History of Present Illness:   Catherine Colon is a 33 y.o. White female that returns to clinic today for re-evaluation of her right shoulder.  She is now 5 weeks from the time of her right shoulder arthroscopy with an arthroscopic SLAP repair, debridement of anterior labrum and debridement of posterior labrum.  The patient's pain is controlled.  The patient is following the postop protocol.  She is doing her stretching exercises.  She has no significant complaints today in clinic.      Wyvonna was present during the entire visit as a chaperone.    History:   The EMR was reviewed and the patient was asked specifically about:  No change since previous visit    Past Medical History:  Past Medical History:   Diagnosis Date    Anxiety     Hypercholesterolemia     Low back pain          Past Surgical History:   Past Surgical History:   Procedure Laterality Date    HX CESAREAN SECTION  2020    HX WISDOM TEETH EXTRACTION  2009    SHOULDER ARTHROSCOPY W/ LABRAL REPAIR Right 11/03/2023         Family History:  Family Medical History:       Problem Relation (Age of Onset)    Heart Attack Paternal Grandfather    High Cholesterol Father, Sister    Stroke Maternal Grandmother            Social History:  Social History     Socioeconomic History    Marital status: Married   Tobacco Use    Smoking status: Never    Smokeless tobacco: Never   Vaping Use    Vaping status: Never Used   Substance and Sexual Activity    Alcohol use: Yes     Comment: weekly    Drug use: Never   Other Topics Concern    Ability to Walk 2 Flight of Steps without SOB/CP Yes     Current Medications:  Current Outpatient Medications   Medication Sig    DIETARY SUPPLEMENT ORAL Take by mouth Daily    FLUoxetine  (PROZAC ) 40 mg Oral Capsule Take 1 Capsule  (40 mg total) by mouth Daily    OTC/HERBAL SUPPLEMENT Calm support, GTA forte ,    SUMAtriptan (IMITREX) 25 mg Oral Tablet Take 1 Tablet (25 mg total) by mouth Once, as needed for Migraine for up to 1 dose May repeat in 2 hours if needed     Allergies:  Allergies[1]    Review of Systems:   Constitutional: Negative  Cardiovascular:  Negative  Respiratory:  Negative  Metabolic/Diabetes:  Negative  GI/Liver:  Negative  GU/Kidney:  Negative   Psychiatry:  Negative  Skin:  Negative  Musculoskeletal:  Right shoulder pain  Neurological: Negative  All other ROS Negative    Physical Exam:   Vitals: Resp 20   Ht 1.626 m (5' 4)   Wt 76.7 kg (169 lb)   LMP 11/07/2023 (Exact Date)   BMI 29.01 kg/m       Constitutional: Pleasant, cooperative female in no  apparent distress  HEENT: Atraumatic, normocephalic, pupils equal  Lungs: Normal respiratory motion  Cardiovascular: Heart regular rate and rhythm, No peripheral edema noted  Musculoskeletal: see orthopedic exam  Skin: No rash or ulcers noted.  Neurologic: Cranial nerves II-XII grossly intact, gait normal  Psychiatric: Alert and oriented x3, affect and behavior within normal limits        Orthopedic Exam:   The patient's was examined today.  She was alert and oriented x3.  Her gait was normal.  Her distal neurovascular exam was within normal limits.  She had a palpable radial pulse.  She had active forward elevation to 150 and active abduction to 150.  She had external rotation with her arm abducted to 90 and internal rotation in this position to 70.  She had active external rotation on examination.  The patient has wounds have healed nicely.  She had no obvious deformities.    Impression:       ICD-10-CM    1. Status post arthroscopy of right shoulder  Z98.890 Physicial Theray (Evaluate and Treat)      2. Right shoulder pain  M25.511 Physicial Theray (Evaluate and Treat)      3. Labral tear of shoulder, right, subsequent encounter  S43.431D Physicial Theray (Evaluate  and Treat)        Normal postoperative course 5 weeks post right shoulder arthroscopy with an arthroscopic SLAP repair, debridement of anterior labrum and debridement of posterior labrum       Plan:     Orders Placed This Encounter    Physicial Theray (Evaluate and Treat)     The plan for this patient is for her to continue to follow the postop protocol.  The patient will continue with her stretching exercises.  These will be done 3 times a day.  The patient will initiate some strengthening exercises with the red Thera-Band.  These will be done once daily for 2-3 minutes each exercise.  These exercises were demonstrated to the patient today.  These exercises consist of internal rotation, external rotation, rowing and a biceps curl.  The patient understands that she still has restrictions with what she can and can not do.  She can not hold anything heavier than a coffee cup.  The patient will follow up in 2 weeks time.  At that time the patient will initiate formal physical therapy.            Alm SQUIBB. Valere, MD, FRCSC, 12/07/2023, 2:12 PM                   [1] No Known Allergies

## 2023-12-27 ENCOUNTER — Ambulatory Visit (INDEPENDENT_AMBULATORY_CARE_PROVIDER_SITE_OTHER): Payer: Self-pay | Admitting: GENERAL

## 2024-01-02 ENCOUNTER — Encounter (INDEPENDENT_AMBULATORY_CARE_PROVIDER_SITE_OTHER): Payer: Self-pay | Admitting: Orthopaedic Surgery

## 2024-01-03 ENCOUNTER — Ambulatory Visit (INDEPENDENT_AMBULATORY_CARE_PROVIDER_SITE_OTHER): Admitting: Orthopaedic Surgery

## 2024-01-03 ENCOUNTER — Encounter (INDEPENDENT_AMBULATORY_CARE_PROVIDER_SITE_OTHER): Payer: Self-pay | Admitting: Orthopaedic Surgery

## 2024-01-03 ENCOUNTER — Other Ambulatory Visit: Payer: Self-pay

## 2024-01-03 VITALS — Resp 20 | Ht 64.0 in | Wt 169.0 lb

## 2024-01-03 DIAGNOSIS — Z4789 Encounter for other orthopedic aftercare: Secondary | ICD-10-CM

## 2024-01-03 DIAGNOSIS — Z9889 Other specified postprocedural states: Secondary | ICD-10-CM

## 2024-01-03 DIAGNOSIS — S43431D Superior glenoid labrum lesion of right shoulder, subsequent encounter: Secondary | ICD-10-CM

## 2024-01-03 DIAGNOSIS — M25511 Pain in right shoulder: Secondary | ICD-10-CM

## 2024-01-03 NOTE — H&P (Signed)
 [name], WINDMILL CROSSING  [address] [place] [address]       Name: Catherine Colon MRN:  [id number]   Date: 01/03/2024 Age: 33 y.o.     Chief Complaint              Shoulder Pain Right             History of Present Illness:   Catherine Colon is a 33 y.o. White female that returns to clinic today for re-evaluation of her right shoulder.  The patient is now 9 weeks from the time of her right shoulder arthroscopy with an arthroscopic SLAP repair, debridement of anterior labrum and debridement of posterior labrum..  The patient's pain is controlled.  She is following the postop protocol.  She is doing her stretching and strengthening exercises.  The patient states that she is feeling better than she did preoperatively.  She has no significant complaints today in clinic.      Wyvonna was present during the entire visit as a chaperone.      History:   The EMR was reviewed and the patient was asked specifically about:  No change since previous visit    Past Medical History:  Past Medical History:   Diagnosis Date    Anxiety     Hypercholesterolemia     Low back pain          Past Surgical History:   Past Surgical History:   Procedure Laterality Date    HX CESAREAN SECTION  2020    HX WISDOM TEETH EXTRACTION  2009    SHOULDER ARTHROSCOPY W/ LABRAL REPAIR Right 11/03/2023         Family History:  Family Medical History:       Problem Relation (Age of Onset)    Heart Attack Paternal Grandfather    High Cholesterol Father, Sister    Stroke Maternal Grandmother            Social History:  Social History     Socioeconomic History    Marital status: Married   Tobacco Use    Smoking status: Never    Smokeless tobacco: Never   Vaping Use    Vaping status: Never Used   Substance and Sexual Activity    Alcohol  use: Yes     Comment: weekly    Drug use: Never   Other Topics Concern    Ability to Walk 2 Flight of Steps without SOB/CP Yes     Current Medications:  Current Outpatient Medications   Medication Sig     DIETARY SUPPLEMENT ORAL Take by mouth Daily    FLUoxetine  (PROZAC ) 40 mg Oral Capsule Take 1 Capsule (40 mg total) by mouth Daily    OTC/HERBAL SUPPLEMENT Calm support, GTA forte ,    SUMAtriptan  (IMITREX ) 25 mg Oral Tablet Take 1 Tablet (25 mg total) by mouth Once, as needed for Migraine for up to 1 dose May repeat in 2 hours if needed     Allergies:  Allergies[1]    Review of Systems:   Constitutional: Negative  Cardiovascular:  Negative  Respiratory:  Negative  Metabolic/Diabetes:  Negative  GI/Liver:  Negative  GU/Kidney:  Negative   Psychiatry:  Negative  Skin:  Negative  Musculoskeletal:  Right shoulder pain  Neurological: Negative  All other ROS Negative    Physical Exam:   Vitals: Resp 20   Ht 1.626 m (5' 4)   Wt 76.7 kg (169 lb)   BMI 29.01 kg/m  Constitutional: Pleasant, cooperative female in no apparent distress  HEENT: Atraumatic, normocephalic, pupils equal  Lungs: Normal respiratory motion  Cardiovascular: Heart regular rate and rhythm, No peripheral edema noted  Musculoskeletal: see orthopedic exam  Skin: No rash or ulcers noted.  Neurologic: Cranial nerves II-XII grossly intact, gait normal  Psychiatric: Alert and oriented x3, affect and behavior within normal limits        Orthopedic Exam:   The patient's was examined today.  She was alert and oriented x3.  Her gait was normal.  Her distal neurovascular exam was within normal limits.  She had a palpable radial pulse.  She had active forward elevation to 180 and active abduction to 180.  She had external rotation with her arm abducted to 110 and internal rotation in this position to 80.  She had active external rotation on examination.  She had 5/5 power forward elevation, abduction, internal rotation and external rotation.  The patient has wounds have healed nicely.  She had no obvious deformities.    Impression:       ICD-10-CM    1. Status post arthroscopy of right shoulder  Z98.890       2. Right shoulder pain  M25.511       3.  Labral tear of shoulder, right, subsequent encounter  S43.431D         Normal postoperative course 9 weeks post right shoulder arthroscopy with an arthroscopic SLAP repair, debridement of anterior labrum and debridement of posterior labrum      Plan:   No orders of the defined types were placed in this encounter.    The plan for this patient is for her to continue to modify her activities and follow the postop protocol.  The patient will continue with her home exercise program.  The patient will initiate formal physical therapy.  The patient understands that she still has restrictions with what she can and can not do.  She can not hold anything heavier than a coffee cup.  The patient may increase her activities incrementally as per physical therapy.  The patient will follow up in 6 weeks time for reassessment.                Alm SQUIBB. Valere, MD, FRCSC, 01/03/2024, 10:14 AM                   [1] No Known Allergies

## 2024-01-16 ENCOUNTER — Other Ambulatory Visit: Payer: Self-pay

## 2024-01-16 ENCOUNTER — Ambulatory Visit (RURAL_HEALTH_CENTER): Payer: Self-pay

## 2024-01-16 DIAGNOSIS — Z23 Encounter for immunization: Secondary | ICD-10-CM

## 2024-01-16 NOTE — Patient Instructions (Signed)
 "Vaccine Information Statement    Hepatitis B Vaccine: What You Need to Know    Many vaccine information statements are available in Spanish and other languages. See promoage.com.br.  Hojas de informacin sobre vacunas estn disponibles en espaol y en muchos otros idiomas. Visite promoage.com.br.    1. Why get vaccinated?    Hepatitis B vaccine can prevent hepatitis B. Hepatitis B is a liver disease that can cause mild illness lasting a few weeks, or it can lead to a serious, lifelong illness.      Acute hepatitis B is a short-term illness that can lead to fever, fatigue, loss of appetite, nausea, vomiting, jaundice (yellow skin or eyes, dark urine, clay-colored bowel movements), and pain in the muscles, joints, and stomach.  Chronic hepatitis B is a long-term illness that occurs when the hepatitis B virus remains in a person's body. Most people who go on to develop chronic hepatitis B do not have symptoms, but it is still very serious and can lead to liver damage (cirrhosis), liver cancer, and death. Chronically infected people can spread hepatitis B virus to others, even if they do not feel or look sick themselves.    Hepatitis B is spread when blood, semen, or other body fluid infected with the hepatitis B virus enters the body of a person who is not infected. People can become infected through:  Birth (if a pregnant woman has hepatitis B, her baby can become infected)  Sharing items such as razors or toothbrushes with an infected person  Contact with the blood or open sores of an infected person  Sex with an infected partner  Sharing needles, syringes, or other drug-injection equipment  Exposure to blood from needlesticks or other sharp instruments    Most people who are vaccinated with hepatitis B vaccine are immune for life.     2. Hepatitis B vaccine    Hepatitis B vaccine is usually given as 2, 3, or 4 shots.    Infants should get their first dose of hepatitis B vaccine at birth and will usually  complete the series at 29-73 months of age. The birth dose of hepatitis B vaccine is an important part of preventing long-term illness in infants and the spread of hepatitis B in the [place] .    Anyone 57 years of age or younger who has not yet gotten the vaccine should be vaccinated.    Hepatitis B vaccination is recommended for adults 60 years or older at increased risk of exposure to hepatitis B who were not vaccinated previously. Adults 60 years or older who are not at increased risk and were not vaccinated in the past may also be vaccinated.     Hepatitis B vaccine may be given as a stand-alone vaccine, or as part of a combination vaccine (a type of vaccine that combines more than one vaccine together into one shot).     Hepatitis B vaccine may be given at the same time as other vaccines.    3. Talk with your health care provider    Tell your vaccination provider if the person getting the vaccine:  Has had an allergic reaction after a previous dose of hepatitis B vaccine, or has any severe, life-threatening allergies     In some cases, your health care provider may decide to postpone hepatitis B vaccination until a future visit.    Pregnant or breastfeeding women who were not vaccinated previously should be vaccinated. Pregnancy or breastfeeding are not reasons to avoid hepatitis  B vaccination.     People with minor illnesses, such as a cold, may be vaccinated. People who are moderately or severely ill should usually wait until they recover before getting hepatitis B vaccine.    Your health care provider can give you more information.    4. Risks of a vaccine reaction    Soreness where the shot is given, fever, headache, and fatigue (feeling tired) can happen after hepatitis B vaccination.  People sometimes faint after medical procedures, including vaccination. Tell your provider if you feel dizzy or have vision changes or ringing in the ears.    As with any medicine, there is a very remote chance of a  vaccine causing a severe allergic reaction, other serious injury, or death.    5. What if there is a serious problem?    An allergic reaction could occur after the vaccinated person leaves the clinic. If you see signs of a severe allergic reaction (hives, swelling of the face and throat, difficulty breathing, a fast heartbeat, dizziness, or weakness), call 9-1-1 and get the person to the nearest hospital.    For other signs that concern you, call your health care provider.    Adverse reactions should be reported to the Vaccine Adverse Event Reporting System (VAERS). Your health care provider will usually file this report, or you can do it yourself. Visit the VAERS website at www.vaers.lagents.no or call (531) 120-5066. VAERS is only for reporting reactions, and VAERS staff members do not give medical advice.    6. The National Vaccine Injury Compensation Program    The [employer] Vaccine Injury Compensation Program (VICP) is a federal program that was created to compensate people who may have been injured by certain vaccines. Claims regarding alleged injury or death due to vaccination have a time limit for filing, which may be as short as two years. Visit the VICP website at spiritualword.at or call 647-371-3942 to learn about the program and about filing a claim.     7. How can I learn more?    Ask your health care provider.   Call your local or state health department.  Visit the website of the Food and Drug Administration (FDA) for vaccine package inserts and additional information at finderlist.no  Contact the Centers for Disease Control and Prevention (CDC):  Call (718)024-8189 (1-800-CDC-INFO) or  Visit CDC's website at piccapture.uy.    Vaccine Information Statement (Interim)  Hepatitis B Vaccine   02/18/2023  [address] [place]  [phone]     Department of Health and Insurance Risk Surveyor for Disease Control and Prevention    Office Use Only    "

## 2024-01-20 ENCOUNTER — Other Ambulatory Visit (HOSPITAL_BASED_OUTPATIENT_CLINIC_OR_DEPARTMENT_OTHER): Payer: Self-pay | Admitting: GENERAL

## 2024-01-23 ENCOUNTER — Ambulatory Visit (HOSPITAL_BASED_OUTPATIENT_CLINIC_OR_DEPARTMENT_OTHER)
Admission: RE | Admit: 2024-01-23 | Discharge: 2024-01-23 | Disposition: A | Discharge status: Still In Hospital | Source: Ambulatory Visit | Attending: Orthopaedic Surgery | Admitting: Orthopaedic Surgery

## 2024-01-23 ENCOUNTER — Other Ambulatory Visit: Payer: Self-pay

## 2024-01-23 ENCOUNTER — Encounter (HOSPITAL_BASED_OUTPATIENT_CLINIC_OR_DEPARTMENT_OTHER): Payer: Self-pay

## 2024-01-23 DIAGNOSIS — S43431D Superior glenoid labrum lesion of right shoulder, subsequent encounter: Secondary | ICD-10-CM | POA: Insufficient documentation

## 2024-01-23 DIAGNOSIS — Z9889 Other specified postprocedural states: Secondary | ICD-10-CM | POA: Insufficient documentation

## 2024-01-23 DIAGNOSIS — M25511 Pain in right shoulder: Secondary | ICD-10-CM | POA: Insufficient documentation

## 2024-01-23 NOTE — PT Evaluation (Signed)
 [facility] Medicine  [facility] Physical Therapy  [address],   [address]  [place], [place]  [address]  ([phone]   (Fax) [phone]    Physical Therapy Initial Evaluation    Patient Name:  Catherine Colon  DOB:  [birth year]    Start of Care:   01/23/2024  Diagnosis:   s/p arthroscopy right shoulder; right shoulder pain; labral tear right shoulder  Onset of Injury:   11/03/2023 surgery right shoulder arthroscopy with arthroscopic SLAP repair, debridement of anterior labrum and posterior labrum  History of Injury:   Patient reports she initially injured her shoulder doing pull ups at cross fit.  She felt a pop and then had continued pain.  Surgery was done in October to repair a labral tear.  She feels she is doing very well with no pain and improved ADLs.  She is right hand dominant.  She reports compliance with her HEP given to her by her doctor including ROM initially and tbands more recently.  She is very active and would like to be able to return to exercise including cross fit.  She returns to her doctor on 02/14/2024.  Per FOTO Intake:  Intake score:  70  Complexity level:   low  PMH:       Past Medical History:   Diagnosis Date    Anxiety     Hypercholesterolemia     Low back pain      Past Surgical History:   Procedure Laterality Date    HX CESAREAN SECTION  2020    HX WISDOM TEETH EXTRACTION  2009    SHOULDER ARTHROSCOPY W/ LABRAL REPAIR Right 11/03/2023     Social:   Patient is a stay at home mom.  Married.  She is home schooling her 46 and 35 year old.  She was very active before, enjoyed doing cross fit.  She is running daily about 2.5 miles.  She is right hand dominant.    Imaging Studies Performed:   none since surgery.    Medications:       Current Outpatient Rx   Medication Sig Dispense Refill    DIETARY SUPPLEMENT ORAL Take by mouth Daily      FLUoxetine  (PROZAC ) 40 mg Oral Capsule Take 1 Capsule (40 mg total) by mouth Daily 90 Capsule 3    OTC/HERBAL SUPPLEMENT Calm support, GTA forte ,       SUMAtriptan  (IMITREX ) 25 mg Oral Tablet Take 1 Tablet (25 mg total) by mouth Once, as needed for Migraine for up to 1 dose May repeat in 2 hours if needed 6 Tablet 0     ROM:     Left shoulder AROM  Flex 168  Abd 175  IR T3  ER T3  Right shoulder AROM  Flex 150  Abd 160  IR L1  ER T3  Neck AROM grossly WFL   Strength:     Left UE MMT grossly 5/5 without pain  Right UE MMT  Shoulder flex 4+/5  Abd 4+/5  IR/ER 4+/5  Bicep/tricep 4+/5    Pain:  No c/o pain currently.  She reports no pain over the past week or 2.    Girth:   No swelling noted.    Sensation:   No c/o numbness or tingling.   Gait:   Normal without assistive device.   Posture:   Patient has fairly good posture, slight increase in lumbar lordosis.  Other:   Incisions closed and healing well.  Assessment:  Catherine Colon is a 34 year old woman presenting to physical therapy 11 weeks post op right shoulder arthroscopic SLAP repair, debridement of anterior labrum and posterior labrum on 11/03/2023 done by Dr. Valere.  She is doing well with no pain complaints.  She has some limitation in AROM compared to her left shoulder as well as some weakness.  She was previously very active with exercise daily including cross fit.  She has 2 young children that she is active with daily.  She wants to be able to return to her prior level of activity.  She will benefit from continued physical therapy to restore full AROM, build strength, and progress functional activity to allow her to return to her prior exercise level once cleared by her doctor.    Patient would benefit from further PT to address the following problems:  Restricted ROM  Decreased strength  Decreased functional activities  Loss of endurance  Pain limiting function    LTGs:  (10 weeks):    1.  Patient will be independent with HEP for ROM/flexibility and strengthening for increased ease of ADLs.  2.  Patient will have full AROM right shoulder to allow normal joint mechanics.  3.  Patient will have 5/5  strength right shoulder to allow return to functional ADL's and exercise with dominant UE.  4.  Patient will report pain right shoulder 0/10 with exercise progression.   5.  Patient will be indep with progression of exercise program and good understanding of precautions.    Treatment may include:  Flexibility/ROM and strengthening exercises, HEP  Therapeutic exercise  Postural training  Manual therapy  Ice    Patient will be seen 1-2x/week x 10 weeks to work on goals stated above.  The patient has been advised of his/her PT diagnosis, goals and POC.   Yes  The patient/family has given verbal consent for evaluation and treatment.   Yes  Total Evaluation Time:  35  Total Timed Treatment Minutes:   (1 TE)  Total Treatment Time:  50    Melissa A. Sharron, MPT, Cert. MDT      ____________________________________________  Physician Signature    Date

## 2024-01-23 NOTE — PT Treatment (Signed)
 [facility] Medicine     [facility] Physical Therapy  [address],   [address]  [place],  [place]  [address]  ([phone]   (Fax) [phone]    Physical Therapy Treatment Note      Patient Name:  Catherine Colon  DOB:  [birth year]    Visit:   1/10-20  1-2x per week x 10 weeks POC  FED [employer]BS  Date of Surgery:   11/03/2023  Right shoulder s/p arthroscopy right shoulder; right shoulder pain; labral tear right shoulder   Next appointment with physician:   Dr. Charlie 02/14/2024    Subjective:   Patient reports she initially injured her shoulder doing pull ups at cross fit.  She felt a pop and then had continued pain.  Surgery was done in October to repair a labral tear.  She feels she is doing very well with no pain and improved ADLs.  She is right hand dominant.  She reports compliance with her HEP given to her by her doctor including ROM initially and tbands more recently.  She is very active and would like to be able to return to exercise including cross fit.  She returns to her doctor on 02/14/2024.     Objective:     Precautions:  Per doctor, gentle ROM, strengthening and modalities.  Therapeutic Exercise:  Patient instructed in and performed the following exercises right shoulder:  UBE no resistance x 1 min each way fwd/back  Pulleys x 2 min  Red tband IR/ER x 2x10 each  Red tband rows x 2x10  Red tband tricep press x 2x10  Red tband bicep curl x 2x10  Prone scapular retractions x 10     Patient given updated exercises for HEP.  Tband exercises 4x per week, ROM exercises can be done daily.    **can add next sessions  Wall slide  IR behind the back  Flexion to 90 at mirror  Sidelying ER  Assessment:   Catherine Colon is a 34 year old woman presenting to physical therapy 11 weeks post op right shoulder arthroscopic SLAP repair, debridement of anterior labrum and posterior labrum on 11/03/2023 done by Dr. Valere. She is doing well with no pain complaints. She has some limitation in AROM compared to her left shoulder as  well as some weakness. She was previously very active with exercise daily including cross fit. She has 2 young children that she is active with daily. She wants to be able to return to her prior level of activity. She will benefit from continued physical therapy to restore full AROM, build strength, and progress functional activity to allow her to return to her prior exercise level once cleared by her doctor.   Plan:   Continue to progress exercises as tolerated.      Total Timed Treatment Minutes:    15 (eval, 1 TE)  Total Treatment Time:    50

## 2024-01-25 ENCOUNTER — Ambulatory Visit
Admission: RE | Admit: 2024-01-25 | Discharge: 2024-01-25 | Disposition: A | Discharge status: Still In Hospital | Source: Ambulatory Visit | Attending: Orthopaedic Surgery | Admitting: Orthopaedic Surgery

## 2024-01-25 ENCOUNTER — Other Ambulatory Visit: Payer: Self-pay

## 2024-01-25 NOTE — PT Treatment (Signed)
 [facility] Medicine     [facility] Physical Therapy  [address],   [address]  [place],  [place]  [address]  ([phone]   (Fax) [phone]    Physical Therapy Treatment Note      Patient Name:  Catherine Colon  DOB:  1990-12-25    Visit:   2/10-20  1-2x per week x 10 weeks POC  FED BCBS  $50 copay  Date of Surgery:   11/03/2023  Right shoulder s/p arthroscopy right shoulder; right shoulder pain; labral tear right shoulder   Next appointment with physician:   Dr. Charlie 02/14/2024    Subjective:   Patient reports she is feeling good.  No shoulder pain to start session.  She reports she was a little sore after last session from doing new exercises but just felt like some muscle soreness.    Objective:     Precautions:  Per doctor, gentle ROM, strengthening and modalities.  Therapeutic Exercise:  Patient instructed in and performed the following exercises right shoulder:  UBE no resistance x 2 min each way fwd/back (4 min total)  Pulleys x 2 min  Red tband IR/ER x 2x10 each  Red tband rows x 2x10  Red tband tricep press x 2x10 (gentle pressure on band)  Red tband bicep curl x 2x10  Standing flexion ROM to 90 at mirror x 10  IR stretch behind back using other hand to help stretch x 10 for 2-3 sec hold  Prone scapular retractions x 10  Sidelying ER x 10     Patient given updated exercises for HEP.  Tband exercises 4x per week, ROM exercises can be done daily.    **can add next sessions  Prone rows  Prone horiz abd  Prone ext  Shrugs  Rhythmic stabilization    Assessment:  Patient continues to progress well with minimal pain complaints and improving functional mobility.  She reports doing cleaning at her home prior to therapy visit and was tired to start session.  No pain in her shoulder.  She was able to add a few new exercises without difficulty.  We discussed therapy visit plan because patient was noted to have a high copay.  Patient agreed that 1x per week would be more reasonable financially.  She will  be compliant with her HEP between visits.  Overall progressing well.  She will benefit from continued physical therapy to restore full AROM, build strength, and progress functional activity to allow her to return to her prior exercise level once cleared by her doctor.   Plan:   Continue to progress exercises as tolerated.      Total Timed Treatment Minutes:  33 (2 TE)  Total Treatment Time:    33

## 2024-01-30 ENCOUNTER — Other Ambulatory Visit: Payer: Self-pay

## 2024-01-30 ENCOUNTER — Ambulatory Visit (HOSPITAL_BASED_OUTPATIENT_CLINIC_OR_DEPARTMENT_OTHER)
Admission: RE | Admit: 2024-01-30 | Discharge: 2024-01-30 | Disposition: A | Discharge status: Still In Hospital | Source: Ambulatory Visit | Attending: Orthopaedic Surgery | Admitting: Orthopaedic Surgery

## 2024-01-30 NOTE — PT Treatment (Signed)
 [facility] Medicine     [facility] Physical Therapy  [address]   [address]  [place],  [place]  [address]  ([phone]   (Fax) [phone]    Physical Therapy Treatment Note      Patient Name:  Catherine Colon  DOB:  05-Sep-1990    Visit:   3/10-20  1-2x per week x 10 weeks POC  FED [employer]BS  $50 copay  Date of Surgery:   11/03/2023  Right shoulder s/p arthroscopy right shoulder; right shoulder pain; labral tear right shoulder   Next appointment with physician:   Dr. Charlie 02/14/2024    Subjective:     She has been waking up with her R  arm over her head and this is uncomfortable, but otherwise not having pain     Objective:     Precautions:  Per doctor, gentle ROM, strengthening and modalities.  Therapeutic Exercise:  Patient instructed in and performed the following exercises right shoulder:  UBE light resistance x 2 min each way fwd/back (4 min total  Pulleys x 2 min  Prone rows 2 arm positions 3 sets 6-10 reps  Prone horiz abd 3 sets 6-10  Prone ext 3 sets 6-10  Rhythmic stabilization in supine 4 bouts arms at 90 degrees     Shrugs against wall  with posture correction and in supine with       Red tband IR/ER x 2x10 each  Red tband rows x 3x10  Red tband tricep press x 3x10 (gentle pressure on band)  Red tband bicep curl x 3x10  Standing flexion ROM to 90 at mirror x 10 2 sets   IR stretch behind back using other hand to help stretch x 10 for 2-3 sec hold  Prone scapular retractions x 10  Sidelying ER x 3 x 10     Reviewed exercises for HEP.  Tband exercises 4x per week, ROM exercises can be done daily.          Assessment:     Today added more active exercises to include prone lying ex. She had some fatigue with this in scapular mm and posterior shoulder.   She also needs to work on posture correction and scapular position with exercises.    She will benefit from continued physical therapy to restore full AROM, build strength, and progress functional activity to allow her to return to her prior  exercise level once cleared by her doctor.   Plan:   Continue to progress exercises as tolerated.      Total Timed Treatment Minutes:  60(4 TE)  Total Treatment Time:    60

## 2024-02-01 ENCOUNTER — Ambulatory Visit (HOSPITAL_BASED_OUTPATIENT_CLINIC_OR_DEPARTMENT_OTHER)

## 2024-02-06 ENCOUNTER — Other Ambulatory Visit: Payer: Self-pay

## 2024-02-06 ENCOUNTER — Ambulatory Visit
Admission: RE | Admit: 2024-02-06 | Discharge: 2024-02-06 | Disposition: A | Discharge status: Still In Hospital | Source: Ambulatory Visit | Attending: Orthopaedic Surgery | Admitting: Orthopaedic Surgery

## 2024-02-06 NOTE — PT Treatment (Signed)
 [facility] Medicine     [facility] Physical Therapy  [address],   [address]  [place],  [place]  [address]  ([phone]   (Fax) [phone]    Physical Therapy Treatment Note      Patient Name:  Catherine Colon  DOB:  [birth year]    Visit:   4/10-20  1-2x per week x 10 weeks POC  FED [employer]BS  $50 copay  Date of Surgery:   11/03/2023  Right shoulder s/p arthroscopy right shoulder; right shoulder pain; labral tear right shoulder   Next appointment with physician:   Dr. Charlie 02/14/2024    Subjective:   Patient reports that her shoulder is doing well. No pain.   Objective:     Precautions:  Per doctor, gentle ROM, strengthening and modalities.  Therapeutic Exercise:  Patient instructed in and performed the following exercises right shoulder:  UBE no resistance x 2 min each way fwd/back (4 min total)  Pulleys x 2 min  Green tband IR/ER x 2x10 each  Green tband rows x 2x10  Green tband tricep press x 2x10 (gentle pressure on band)  Green tband bicep curl x 2x10  Standing flexion ROM to 90 at mirror x 10  IR stretch behind back using other hand to help stretch x 10 for 2-3 sec hold  Prone scapular retractions 2x 10 (added at about 30 degrees of abd)  Sidelying ER x 10  Sidelying abd x10  Sidelying shoulder abd x10  Prone rows 2 arm positions 3 sets 6-10 reps  Prone horiz abd 3 sets 6-10  Prone ext 3 sets 6-10 --2 sets today  Rhythmic stabilization in supine 4 bouts arms at 90 degrees      Patient given updated exercises for HEP.  Tband exercises 4x per week, ROM exercises can be done daily.      Assessment:  Patient presents to session with no complaints upon arrival. Pt is being seen in shortened 30 minute session with exercises in bold being performed only.  No complaints noted after her last session.  Increased t-band resistance with pt tolerating well. Green band provided to pt for home.  Tactile cue for pt to us  less of an arch in her low back when performing prone scapular retractions. Few new exercises  added today in sidelying for strengthening with pt tolerating well. She noted some popping within her shoulder when performing sidelying shoulder abduction, but no pain present.  Pt denies any pain with completion of exercises.  Patient agreed that 1x per week would be more reasonable financially.  She will be compliant with her HEP between visits.  Overall progressing well.  She will benefit from continued physical therapy to restore full AROM, build strength, and progress functional activity to allow her to return to her prior exercise level once cleared by her doctor.   Plan:   Continue to progress exercises as tolerated.      Total Timed Treatment Minutes:  32 (2 TE)  Total Treatment Time:    32

## 2024-02-08 ENCOUNTER — Ambulatory Visit (HOSPITAL_BASED_OUTPATIENT_CLINIC_OR_DEPARTMENT_OTHER)

## 2024-02-13 ENCOUNTER — Ambulatory Visit (HOSPITAL_BASED_OUTPATIENT_CLINIC_OR_DEPARTMENT_OTHER)

## 2024-02-14 ENCOUNTER — Encounter (INDEPENDENT_AMBULATORY_CARE_PROVIDER_SITE_OTHER): Payer: Self-pay | Admitting: Orthopaedic Surgery

## 2024-02-15 ENCOUNTER — Ambulatory Visit

## 2024-02-20 ENCOUNTER — Other Ambulatory Visit: Payer: Self-pay

## 2024-02-20 ENCOUNTER — Ambulatory Visit
Admission: RE | Admit: 2024-02-20 | Discharge: 2024-02-20 | Disposition: A | Discharge status: Still In Hospital | Source: Ambulatory Visit | Attending: Orthopaedic Surgery

## 2024-02-20 NOTE — PT Treatment (Signed)
 [facility] Medicine     [facility] Physical Therapy  [address],   [address]  [place],  [place]  [address]  ([phone]   (Fax) [phone]    Physical Therapy Treatment Note      Patient Name:  Catherine Colon  DOB:  Aug 30, 1990    Visit:   5/10-20  1-2x per week x 10 weeks POC  FED [employer]BS  $50 copay  Date of Surgery:   11/03/2023  Right shoulder s/p arthroscopy right shoulder; right shoulder pain; labral tear right shoulder   Next appointment with physician:   Dr. Charlie 02/14/2024    Subjective:   Patient reports that her shoulder is doing really well. She is doing ex's at home and she is not having any shoulder pain or clicking.   Pt says she is going to have stop therapy soon, she is moving to [place] .    Objective:     Precautions:  Per doctor, gentle ROM, strengthening and modalities.  Therapeutic Exercise:  Patient instructed in and performed the following exercises right shoulder:  UBE no resistance x 2 min each way fwd/back (4 min total)  Pulleys x 2 min  Wall slides flexion x 5   Blue tband IR/ER x 2x10 each  Blue tband rows x 2x10  Blue tband tricep press x 2x10 (gentle pressure on band)  Blue tband bicep curl x 2x10  Standing flexion 2# and scaption 2#, abduction 0# x 10  IR stretch behind back using other hand to help stretch x 10 for 2-3 sec hold  Prone scapular retractions 2x 10 (added at about 30 degrees of abd)  Sidelying ER x 10 2#  Sidelying abd x10 2#  Sidelying shoulder abd x10 2#  Prone rows 2 sets 10 reps 2#  Prone horiz abd 2 sets 10 2#  Prone ext 2 sets 10 2#  Rhythmic stabilization in supine 4 bouts arms at 90 degrees      Patient given updated exercises for HEP.  Tband exercises 4x per week, ROM exercises can be done daily.      Assessment:  Pt presents without c/o pain or clicking in her shoulder and states she is doing really well.  She reports compliance with HEP.   Increased t-band resistance with pt tolerating well. Blue band provided to pt for home.  Added standing scaption  and abduction today for further strengthening.  Also showed pt flexion wall slide with lean in to stretch end range flexion.  SL and prone ex's performed with 2# weight today which was more challenging with c/o fatigue only.   All ex's performed without pain or shoulder clicking.  Pt is moving to [place]  and would like to come to PT for one more visit to close out her chart and get updated HEP if needed.  Overall Rinda is doing very well and is compliant with HEP.   She will benefit from continued physical therapy to restore full AROM, build strength, and progress functional activity to allow her to return to her prior exercise level once cleared by her doctor.   Plan:   Continue to progress exercises as tolerated.      Total Timed Treatment Minutes:  44 (3 TE)  Total Treatment Time:    44

## 2024-03-01 ENCOUNTER — Ambulatory Visit (HOSPITAL_BASED_OUTPATIENT_CLINIC_OR_DEPARTMENT_OTHER)

## 2024-05-16 ENCOUNTER — Ambulatory Visit (INDEPENDENT_AMBULATORY_CARE_PROVIDER_SITE_OTHER): Payer: Self-pay
